# Patient Record
Sex: Female | Born: 1935 | Race: White | Hispanic: No | Smoking: Never smoker
Health system: Southern US, Community
[De-identification: ages and names within clinical notes are randomized; demographics above are authoritative.]

## PROBLEM LIST (undated history)

## (undated) DIAGNOSIS — F0280 Dementia in other diseases classified elsewhere without behavioral disturbance: Secondary | ICD-10-CM

## (undated) DIAGNOSIS — G309 Alzheimer's disease, unspecified: Secondary | ICD-10-CM

---

## 1982-03-27 HISTORY — PX: KNEE SURGERY: SHX244

## 2014-05-26 HISTORY — PX: HIP FRACTURE SURGERY: SHX118

## 2015-04-14 DIAGNOSIS — H903 Sensorineural hearing loss, bilateral: Secondary | ICD-10-CM | POA: Diagnosis not present

## 2015-04-14 DIAGNOSIS — H9190 Unspecified hearing loss, unspecified ear: Secondary | ICD-10-CM | POA: Diagnosis not present

## 2015-05-25 DIAGNOSIS — L304 Erythema intertrigo: Secondary | ICD-10-CM | POA: Diagnosis not present

## 2015-12-10 DIAGNOSIS — R110 Nausea: Secondary | ICD-10-CM | POA: Diagnosis not present

## 2015-12-10 DIAGNOSIS — R410 Disorientation, unspecified: Secondary | ICD-10-CM | POA: Diagnosis not present

## 2015-12-10 DIAGNOSIS — N390 Urinary tract infection, site not specified: Secondary | ICD-10-CM | POA: Diagnosis not present

## 2015-12-10 DIAGNOSIS — R5383 Other fatigue: Secondary | ICD-10-CM | POA: Diagnosis not present

## 2015-12-10 DIAGNOSIS — F0390 Unspecified dementia without behavioral disturbance: Secondary | ICD-10-CM | POA: Diagnosis not present

## 2015-12-10 DIAGNOSIS — S0003XA Contusion of scalp, initial encounter: Secondary | ICD-10-CM | POA: Diagnosis not present

## 2015-12-10 DIAGNOSIS — S0990XA Unspecified injury of head, initial encounter: Secondary | ICD-10-CM | POA: Diagnosis not present

## 2017-03-27 ENCOUNTER — Other Ambulatory Visit: Payer: Self-pay

## 2017-03-27 ENCOUNTER — Ambulatory Visit
Admission: EM | Admit: 2017-03-27 | Discharge: 2017-03-27 | Disposition: A | Discharge status: Home / Self Care | Payer: Medicare Other | Attending: Family Medicine | Admitting: Family Medicine

## 2017-03-27 DIAGNOSIS — B353 Tinea pedis: Secondary | ICD-10-CM | POA: Diagnosis not present

## 2017-03-27 DIAGNOSIS — L03031 Cellulitis of right toe: Secondary | ICD-10-CM

## 2017-03-27 MED ORDER — CEPHALEXIN 250 MG/5ML PO SUSR: 500.0000 mg | Freq: Three times a day (TID) | ORAL | 0 refills | Status: AC

## 2017-03-27 NOTE — ED Triage Notes (Signed)
Patient presents to [facility] with daughter. Patient daughter states that a few weeks ago her caregiver reported some redness on both feet. Patient daughter bought some OTC fungal cream that made the redness worse. They have since been treating feet with charcoal. Patient daughter states that feet have been worsening.

## 2017-03-27 NOTE — ED Provider Notes (Signed)
[facility]    CSN: [id number] Arrival date & time: 03/27/17  1538     History   Chief Complaint Chief Complaint  Patient presents with  . Cellulitis    HPI Misty SarkMary Anderson is a 84 y.o. female.   84 yo female presents with daughter with a concern for redness to both feet. State patient has chronic toenail fungus. No drainage or fevers.    The history is provided by a relative.    History reviewed. No pertinent past medical history.  There are no active problems to display for this patient.   Past Surgical History:  Procedure Laterality Date  . HIP FRACTURE SURGERY Left 05/2014  . KNEE SURGERY Right 1984    OB History    No data available       Home Medications    Prior to Admission medications   Medication Sig Start Date End Date Taking? Authorizing Provider  cephALEXin (KEFLEX) 250 MG/5ML suspension Take 10 mLs (500 mg total) by mouth 3 (three) times daily for 7 days. 03/27/17 04/03/17  Payton Mccallumonty, Anyelina Claycomb, MD    Family History Family History  Problem Relation Age of Onset  . Alzheimer's disease Mother   . Cancer Father     Social History Social History   Tobacco Use  . Smoking status: Never Smoker  . Smokeless tobacco: Never Used  Substance Use Topics  . Alcohol use: No    Frequency: Never  . Drug use: No     Allergies   Patient has no known allergies.   Review of Systems Review of Systems   Physical Exam Triage Vital Signs ED Triage Vitals  Enc Vitals Group     BP 03/27/17 1607 (!) 176/74     Pulse Rate 03/27/17 1607 71     Resp 03/27/17 1607 16     Temp 03/27/17 1607 98.7 F (37.1 C)     Temp Source 03/27/17 1607 Oral     SpO2 03/27/17 1607 99 %     Weight 03/27/17 1607 115 lb (52.2 kg)     Height 03/27/17 1607 5' (1.524 m)     Head Circumference --      Peak Flow --      Pain Score 03/27/17 1608 0     Pain Loc --      Pain Edu? --      Excl. in GC? --    No data found.  Updated Vital Signs BP (!) 176/74 (BP Location:  Left Arm)   Pulse 71   Temp 98.7 F (37.1 C) (Oral)   Resp 16   Ht 5' (1.524 m)   Wt 115 lb (52.2 kg)   SpO2 99%   BMI 22.46 kg/m   Visual Acuity Right Eye Distance:   Left Eye Distance:   Bilateral Distance:    Right Eye Near:   Left Eye Near:    Bilateral Near:     Physical Exam  Constitutional: She appears well-developed and well-nourished. No distress.  Skin: She is not diaphoretic. There is erythema.  Right 4th toe with mild edema, erythema; feet with cracked skin, diffuse erythema; thickened discolored toenails  Nursing note and vitals reviewed.    UC Treatments / Results  Labs (all labs ordered are listed, but only abnormal results are displayed) Labs Reviewed - No data to display  EKG  EKG Interpretation None       Radiology No results found.  Procedures Procedures (including critical care time)  Medications Ordered  in UC Medications - No data to display   Initial Impression / Assessment and Plan / UC Course  I have reviewed the triage vital signs and the nursing notes.  Pertinent labs & imaging results that were available during my care of the patient were reviewed by me and considered in my medical decision making (see chart for details).        Final Clinical Impressions(s) / UC Diagnoses   Final diagnoses:  Cellulitis of toe of right foot  Tinea pedis of both feet    ED Discharge Orders        Ordered    cephALEXin (KEFLEX) 250 MG/5ML suspension  3 times daily     03/27/17 1630     1. diagnosis reviewed with family 2. rx as per orders above; reviewed possible side effects, interactions, risks and benefits  3. Recommend supportive treatment with otc antifungal powder to feet bid 4. Follow-up prn if symptoms worsen or don't improve   Controlled Substance Prescriptions [place] Controlled Substance Registry consulted? Not Applicable   Payton Mccallum, MD 03/27/17 [phone]

## 2017-06-27 DIAGNOSIS — F0390 Unspecified dementia without behavioral disturbance: Secondary | ICD-10-CM | POA: Diagnosis not present

## 2017-09-17 ENCOUNTER — Other Ambulatory Visit: Payer: Self-pay

## 2017-09-17 ENCOUNTER — Inpatient Hospital Stay
Admission: EM | Admit: 2017-09-17 | Discharge: 2017-09-19 | DRG: 690 | Disposition: A | Discharge status: Home / Self Care | Payer: Medicare Other | Attending: Family Medicine | Admitting: Family Medicine

## 2017-09-17 ENCOUNTER — Emergency Department: Payer: Medicare Other

## 2017-09-17 ENCOUNTER — Encounter: Payer: Self-pay | Admitting: Emergency Medicine

## 2017-09-17 DIAGNOSIS — G9349 Other encephalopathy: Secondary | ICD-10-CM | POA: Diagnosis present

## 2017-09-17 DIAGNOSIS — R4020 Unspecified coma: Secondary | ICD-10-CM | POA: Diagnosis not present

## 2017-09-17 DIAGNOSIS — G309 Alzheimer's disease, unspecified: Secondary | ICD-10-CM | POA: Diagnosis present

## 2017-09-17 DIAGNOSIS — N390 Urinary tract infection, site not specified: Secondary | ICD-10-CM | POA: Diagnosis not present

## 2017-09-17 DIAGNOSIS — J209 Acute bronchitis, unspecified: Secondary | ICD-10-CM | POA: Diagnosis present

## 2017-09-17 DIAGNOSIS — E871 Hypo-osmolality and hyponatremia: Secondary | ICD-10-CM | POA: Diagnosis present

## 2017-09-17 DIAGNOSIS — R001 Bradycardia, unspecified: Secondary | ICD-10-CM | POA: Diagnosis not present

## 2017-09-17 DIAGNOSIS — Z66 Do not resuscitate: Secondary | ICD-10-CM | POA: Diagnosis present

## 2017-09-17 DIAGNOSIS — E860 Dehydration: Secondary | ICD-10-CM | POA: Diagnosis present

## 2017-09-17 DIAGNOSIS — G9340 Encephalopathy, unspecified: Secondary | ICD-10-CM | POA: Diagnosis not present

## 2017-09-17 DIAGNOSIS — R4182 Altered mental status, unspecified: Secondary | ICD-10-CM | POA: Diagnosis not present

## 2017-09-17 DIAGNOSIS — F0280 Dementia in other diseases classified elsewhere without behavioral disturbance: Secondary | ICD-10-CM | POA: Diagnosis present

## 2017-09-17 DIAGNOSIS — E46 Unspecified protein-calorie malnutrition: Secondary | ICD-10-CM | POA: Diagnosis present

## 2017-09-17 DIAGNOSIS — M6281 Muscle weakness (generalized): Secondary | ICD-10-CM | POA: Diagnosis not present

## 2017-09-17 DIAGNOSIS — Z6821 Body mass index (BMI) 21.0-21.9, adult: Secondary | ICD-10-CM

## 2017-09-17 DIAGNOSIS — R509 Fever, unspecified: Secondary | ICD-10-CM | POA: Diagnosis present

## 2017-09-17 DIAGNOSIS — I10 Essential (primary) hypertension: Secondary | ICD-10-CM | POA: Diagnosis present

## 2017-09-17 DIAGNOSIS — Z87898 Personal history of other specified conditions: Secondary | ICD-10-CM

## 2017-09-17 DIAGNOSIS — N3000 Acute cystitis without hematuria: Secondary | ICD-10-CM | POA: Diagnosis not present

## 2017-09-17 HISTORY — DX: Alzheimer's disease, unspecified: G30.9

## 2017-09-17 HISTORY — DX: Dementia in other diseases classified elsewhere, unspecified severity, without behavioral disturbance, psychotic disturbance, mood disturbance, and anxiety: F02.80

## 2017-09-17 LAB — URINALYSIS, COMPLETE (UACMP) WITH MICROSCOPIC
Bilirubin Urine: NEGATIVE
Glucose, UA: NEGATIVE mg/dL
Ketones, ur: NEGATIVE mg/dL
Nitrite: POSITIVE — AB
PROTEIN: NEGATIVE mg/dL
Specific Gravity, Urine: 1.008 (ref 1.005–1.030)
pH: 7 (ref 5.0–8.0)

## 2017-09-17 LAB — CBC WITH DIFFERENTIAL/PLATELET
Basophils Absolute: 0 10*3/uL (ref 0–0.1)
Basophils Relative: 0 %
EOS PCT: 0 %
Eosinophils Absolute: 0 10*3/uL (ref 0–0.7)
HCT: 36.5 % (ref 35.0–47.0)
HEMOGLOBIN: 12.2 g/dL (ref 12.0–16.0)
LYMPHS ABS: 1.7 10*3/uL (ref 1.0–3.6)
Lymphocytes Relative: 22 %
MCH: 30.5 pg (ref 26.0–34.0)
MCHC: 33.4 g/dL (ref 32.0–36.0)
MCV: 91.3 fL (ref 80.0–100.0)
MONO ABS: 0.8 10*3/uL (ref 0.2–0.9)
MONOS PCT: 11 %
Neutro Abs: 5 10*3/uL (ref 1.4–6.5)
Neutrophils Relative %: 67 %
Platelets: 122 10*3/uL — ABNORMAL LOW (ref 150–440)
RBC: 4 MIL/uL (ref 3.80–5.20)
RDW: 14.7 % — AB (ref 11.5–14.5)
WBC: 7.6 10*3/uL (ref 3.6–11.0)

## 2017-09-17 LAB — COMPREHENSIVE METABOLIC PANEL
ALBUMIN: 3.3 g/dL — AB (ref 3.5–5.0)
ALK PHOS: 59 U/L (ref 38–126)
ALT: 12 U/L — AB (ref 14–54)
AST: 24 U/L (ref 15–41)
Anion gap: 8 (ref 5–15)
BUN: 18 mg/dL (ref 6–20)
CALCIUM: 8.5 mg/dL — AB (ref 8.9–10.3)
CHLORIDE: 100 mmol/L — AB (ref 101–111)
CO2: 22 mmol/L (ref 22–32)
CREATININE: 0.57 mg/dL (ref 0.44–1.00)
GFR calc non Af Amer: >60 mL/min (ref >60)
GLUCOSE: 100 mg/dL — AB (ref 65–99)
Potassium: 3.7 mmol/L (ref 3.5–5.1)
Sodium: 130 mmol/L — ABNORMAL LOW (ref 135–145)
Total Bilirubin: 0.7 mg/dL (ref 0.3–1.2)
Total Protein: 7.4 g/dL (ref 6.5–8.1)

## 2017-09-17 LAB — MRSA PCR SCREENING: MRSA BY PCR: NEGATIVE

## 2017-09-17 LAB — INFLUENZA PANEL BY PCR (TYPE A & B)
Influenza A By PCR: NEGATIVE
Influenza B By PCR: NEGATIVE

## 2017-09-17 MED ORDER — SODIUM CHLORIDE 0.9 % IV BOLUS
1000.0000 mL | Freq: Once | INTRAVENOUS | Status: AC
  Administered 2017-09-17: 1000 mL via INTRAVENOUS

## 2017-09-17 MED ORDER — ACETAMINOPHEN 325 MG PO TABS: 650.0000 mg | ORAL_TABLET | Freq: Four times a day (QID) | ORAL | Status: DC | PRN

## 2017-09-17 MED ORDER — ACETAMINOPHEN 650 MG RE SUPP: 650.0000 mg | Freq: Four times a day (QID) | RECTAL | Status: DC | PRN

## 2017-09-17 MED ORDER — SODIUM CHLORIDE 0.9 % IV SOLN
INTRAVENOUS | Status: DC
  Administered 2017-09-17 – 2017-09-18 (×2): via INTRAVENOUS

## 2017-09-17 MED ORDER — ENOXAPARIN SODIUM 40 MG/0.4ML ~~LOC~~ SOLN
40.0000 mg | SUBCUTANEOUS | Status: DC
  Administered 2017-09-17: 40 mg via SUBCUTANEOUS
  Filled 2017-09-17 (×2): qty 0.4

## 2017-09-17 MED ORDER — ONDANSETRON HCL 4 MG/2ML IJ SOLN: 4.0000 mg | Freq: Four times a day (QID) | INTRAMUSCULAR | Status: DC | PRN

## 2017-09-17 MED ORDER — SODIUM CHLORIDE 0.9 % IV BOLUS
1000.0000 mL | Freq: Once | INTRAVENOUS | Status: AC
Start: 2017-09-17 — End: 2017-09-17
  Administered 2017-09-17: 1000 mL via INTRAVENOUS

## 2017-09-17 MED ORDER — SODIUM CHLORIDE 0.9 % IV SOLN
500.0000 mg | Freq: Every day | INTRAVENOUS | Status: DC
  Filled 2017-09-17: qty 500

## 2017-09-17 MED ORDER — SODIUM CHLORIDE 0.9 % IV SOLN
500.0000 mg | INTRAVENOUS | Status: DC
  Filled 2017-09-17: qty 500

## 2017-09-17 MED ORDER — AZITHROMYCIN 500 MG IV SOLR
500.0000 mg | Freq: Once | INTRAVENOUS | Status: AC
  Administered 2017-09-17: 500 mg via INTRAVENOUS
  Filled 2017-09-17: qty 500

## 2017-09-17 MED ORDER — SODIUM CHLORIDE 0.9 % IV SOLN
1.0000 g | Freq: Once | INTRAVENOUS | Status: AC
  Administered 2017-09-17: 1 g via INTRAVENOUS
  Filled 2017-09-17: qty 10

## 2017-09-17 MED ORDER — ONDANSETRON HCL 4 MG PO TABS
4.0000 mg | ORAL_TABLET | Freq: Four times a day (QID) | ORAL | Status: DC | PRN
Start: 2017-09-17 — End: 2017-09-19

## 2017-09-17 MED ORDER — SODIUM CHLORIDE 0.9 % IV SOLN
1.0000 g | INTRAVENOUS | Status: DC
  Administered 2017-09-18 – 2017-09-19 (×2): 1 g via INTRAVENOUS
  Filled 2017-09-17: qty 10
  Filled 2017-09-17: qty 1

## 2017-09-17 MED ORDER — SODIUM CHLORIDE 0.9 % IV SOLN: 1.0000 g | INTRAVENOUS | Status: DC

## 2017-09-17 NOTE — H&P (Addendum)
[facility] - [place] [facility]   PATIENT NAME: Misty Anderson    MR#:  [id number]  DATE OF BIRTH:  [birth year]  DATE OF ADMISSION:  09/17/2017  PRIMARY CARE PHYSICIAN: Ulice Brilliant, MD   REQUESTING/REFERRING PHYSICIAN: Arnaldo Natal, MD  CHIEF COMPLAINT:  AMS  HISTORY OF PRESENT ILLNESS:  Misty Anderson  is a 84 y.o. female with a known history of Alzheimer's dementia was awake and alert until yesterday and feeding herself is  quite altered today.  Patient at her baseline talks and walks with the help of a walker.  Patient's temperature was at 106 at home and by the time.  Patient came into the ED her temperature was 99.8.  Patient is arousable and unable to give any history.  History is obtained from the patient's daughter.  CT head negative chest x-ray negative.  Flu test is pending Patient has been coughing yellowish phlegm according to the patient's daughter for the past few days  PAST MEDICAL HISTORY:   Past Medical History:  Diagnosis Date  . Alzheimer disease     PAST SURGICAL HISTOIRY:   Past Surgical History:  Procedure Laterality Date  . HIP FRACTURE SURGERY Left 05/2014  . KNEE SURGERY Right 1984    SOCIAL HISTORY:   Social History   Tobacco Use  . Smoking status: Never Smoker  . Smokeless tobacco: Never Used  Substance Use Topics  . Alcohol use: No    Frequency: Never    FAMILY HISTORY:   Family History  Problem Relation Age of Onset  . Alzheimer's disease Mother   . Cancer Father     DRUG ALLERGIES:  No Known Allergies  REVIEW OF SYSTEMS:  Ros UNOBTAINABLE  MEDICATIONS AT HOME:   Prior to Admission medications   Medication Sig Start Date End Date Taking? Authorizing Provider  Ascorbic Acid (VITAMIN C) 100 MG tablet Take 1 tablet by mouth daily.   Yes [provider]  Cranberry 400 MG CAPS Take 1 capsule by mouth daily.   Yes [provider]      VITAL SIGNS:  Blood pressure (!) 176/81, pulse (!)  54, temperature 98 F (36.7 C), temperature source Oral, resp. rate 18, height 5\' 2"  (1.575 m), weight 52.2 kg (115 lb), SpO2 100 %.  PHYSICAL EXAMINATION:  GENERAL:  84 y.o.-year-old patient lying in the bed with no acute distress.  EYES: Pupils equal, round, reactive to light and accommodation. No scleral icterus. Extraocular muscles intact.  HEENT: Head atraumatic, normocephalic. Oropharynx and nasopharynx clear.  NECK:  Supple, no jugular venous distention. No thyroid enlargement, no tenderness.  LUNGS: mOd breath sounds bilaterally, no wheezing, rales,rhonchi or crepitation. No use of accessory muscles of respiration.  CARDIOVASCULAR: S1, S2 normal. No murmurs, rubs, or gallops.  ABDOMEN: Soft, nontender, nondistended. Bowel sounds present. No organomegaly or mass.  EXTREMITIES: No pedal edema, cyanosis, or clubbing.  NEUROLOGIC: Lethargic but arousable but disoriented PSYCHIATRIC: The patient is arousable and disoriented SKIN: No obvious rash, lesion, or ulcer.   LABORATORY PANEL:   CBC Recent Labs  Lab 09/17/17 1237  WBC 7.6  HGB 12.2  HCT 36.5  PLT 122*   ------------------------------------------------------------------------------------------------------------------  Chemistries  Recent Labs  Lab 09/17/17 1237  NA 130*  K 3.7  CL 100*  CO2 22  GLUCOSE 100*  BUN 18  CREATININE 0.57  CALCIUM 8.5*  AST 24  ALT 12*  ALKPHOS 59  BILITOT 0.7   ------------------------------------------------------------------------------------------------------------------  Cardiac Enzymes No results for  input(s): TROPONINI in the last 168 hours. ------------------------------------------------------------------------------------------------------------------  RADIOLOGY:  Ct Head Wo Contrast  Result Date: 09/17/2017 CLINICAL DATA:  Altered level of consciousness. EXAM: CT HEAD WITHOUT CONTRAST TECHNIQUE: Contiguous axial images were obtained from the base of the skull  through the vertex without intravenous contrast. COMPARISON:  None. FINDINGS: Brain: Moderate to advanced atrophy. Negative for hydrocephalus. Extensive chronic white matter changes. Negative for acute infarct. Negative for hemorrhage mass or fluid collection. Vascular: Negative for hyperdense vessel. Heavily calcified arteries. Skull: Large lytic lesion in the occipital bone in the midline most compatible with a benign lesion such as hemangioma. This has fatty density. Sinuses/Orbits: Mucosal edema in the left sphenoid maxillary and ethmoid sinuses. Negative orbit Other: None IMPRESSION: Moderate to advanced atrophy and chronic microvascular ischemia. No acute intracranial abnormality. Electronically Signed   By: Marlan Palauharles  Clark M.D.   On: 09/17/2017 13:22   Dg Chest Portable 1 View  Result Date: 09/17/2017 CLINICAL DATA:  Altered mental status. Underlying Alzheimer's disease. Nonsmoker. EXAM: PORTABLE CHEST 1 VIEW COMPARISON:  None in PACs FINDINGS: The lungs are adequately inflated and clear. The heart is top-normal in size. The pulmonary vascularity is normal. There is mural calcification in the thoracic aorta with tortuosity of its ascending and descending components. The mediastinum is grossly normal in width. The bony thorax is unremarkable. IMPRESSION: There is no active cardiopulmonary disease. Thoracic aortic atherosclerosis. Electronically Signed   By: David  [place] M.D.   On: 09/17/2017 13:16    EKG:   Orders placed or performed during the hospital encounter of 09/17/17  . EKG 12-Lead  . EKG 12-Lead  . ED EKG  . ED EKG  . EKG 12-Lead  . EKG 12-Lead    IMPRESSION AND PLAN:  Misty SarkMary Anderson  is a 84 y.o. female with a known history of Alzheimer's dementia was awake and alert until yesterday and feeding herself is  quite altered today.  Patient at her baseline talks and walks with the help of a walker.  Patient's temperature was at 106 at home and by the time.  Patient came into the ED her  temperature was 99.8.  Patient is arousable and unable to give any history.  History is obtained from the patient's daughter.  CT head negative chest x-ray negative   #Acute encephalopathy from acute bronchitis-probably viral  admit to MedSurg unit IV Rocephin and azithromycin, bronchodilator treatments We will get sputum culture and sensitivity Hydrate with IV fluids and symptomatic treatment  neurochecks  #Hyponatremia from dehydration provide gentle hydration with IV fluids  #Fever probably from acute cystitis and bronchitis- prob viral Blood cultures, urine cultures, sputum culture and sensitivity Flu test is neg Supportive treatment  Urinalysis is abnormal IV Rocephin and azithromycin  #chronic  Alzheimer's dementia Continue close monitoring patient is currently n.p.o.  GI and DVT prophylaxis All the records are reviewed and case discussed with ED provider. Management plans discussed with the patient, family and they are in agreement.  CODE STATUS: DNR   TOTAL TIME TAKING CARE OF THIS PATIENT: 41  minutes.   Note: This dictation was prepared with Dragon dictation along with smaller phrase technology. Any transcriptional errors that result from this process are unintentional.  Ramonita LabAruna Ples Trudel M.D on 09/17/2017 at 4:10 PM  Between 7am to 6pm - Pager - [phone]  After 6pm go to [email or web address] - password [email or web address] [facility]  [facility]  Office  [phone]  CC: Primary care physician; Ulice BrilliantBarnes, Brad, MD

## 2017-09-17 NOTE — ED Provider Notes (Signed)
[facility] Emergency Department Provider Note   ____________________________________________   First MD Initiated Contact with Patient 09/17/17 1244     (approximate)  I have reviewed the triage vital signs and the nursing notes.   HISTORY  Chief Complaint Weakness History limited by unresponsiveness   HPI Clatie Kessen is a 84 y.o. female who lives at home and usually wakes up and talks and walks with a walker.  Family reports today she was unable to do any walking really was not speaking much seem to be mostly unresponsive and more confused than usual.  She had a temperature that look like it was going up to 106 on the thermometer.  Family put cool compresses on her and brought her to the emergency room.  Here her temperature was 99.8.  She response to pain really not anything else.  Patient had symptoms similar to this a few years ago and had pneumonia.  Family reports now she is been coughing up yellowish phlegm for the last couple days.   Past Medical History:  Diagnosis Date  . Alzheimer disease     Patient Active Problem List   Diagnosis Date Noted  . Fever 09/17/2017  . Altered mental status 09/17/2017    Past Surgical History:  Procedure Laterality Date  . HIP FRACTURE SURGERY Left 05/2014  . KNEE SURGERY Right 1984    Prior to Admission medications   Medication Sig Start Date End Date Taking? Authorizing Provider  Ascorbic Acid (VITAMIN C) 100 MG tablet Take 1 tablet by mouth daily.   Yes [provider]  Cranberry 400 MG CAPS Take 1 capsule by mouth daily.   Yes [provider]    Allergies Patient has no known allergies.  Family History  Problem Relation Age of Onset  . Alzheimer's disease Mother   . Cancer Father     Social History Social History   Tobacco Use  . Smoking status: Never Smoker  . Smokeless tobacco: Never Used  Substance Use Topics  . Alcohol use: No    Frequency: Never  . Drug use: No     Review of Systems  Unable to obtain ____________________________________________   PHYSICAL EXAM:  VITAL SIGNS: ED Triage Vitals [09/17/17 1233]  Enc Vitals Group     BP (!) 187/78     Pulse Rate 60     Resp 20     Temp 99.8 F (37.7 C)     Temp Source Axillary     SpO2 97 %     Weight 115 lb (52.2 kg)     Height 5\' 2"  (1.575 m)     Head Circumference      Peak Flow      Pain Score      Pain Loc      Pain Edu?      Excl. in GC?     Constitutional: Sleepy limited responsiveness Eyes: Patient not opening eyes Head: Atraumatic. Nose: No congestion/rhinnorhea. Mouth/Throat: Mucous membranes are moist.   Neck: No stridor.neck is supple cardiovascular: Normal rate, regular rhythm. Grossly normal heart sounds.  Good peripheral circulation. Respiratory: Normal respiratory effort.  No retractions. Lungs CTAB. Gastrointestinal: Soft and nontender. No distention. No abdominal bruits. No CVA tenderness. Musculoskeletal: No lower extremity tenderness nor edema.   Neurologic:  Normal speech and language. No gross focal neurologic deficits are appreciated. No gait instability. Skin:  Skin is warm, dry and intact. No rash noted. Psychiatric: Mood and affect are normal. Speech and  behavior are normal.  ____________________________________________   LABS (all labs ordered are listed, but only abnormal results are displayed)  Labs Reviewed  URINALYSIS, COMPLETE (UACMP) WITH MICROSCOPIC - Abnormal; Notable for the following components:      Result Value   Color, Urine YELLOW (*)    APPearance CLEAR (*)    Hgb urine dipstick MODERATE (*)    Nitrite POSITIVE (*)    Leukocytes, UA SMALL (*)    Bacteria, UA MANY (*)    All other components within normal limits  COMPREHENSIVE METABOLIC PANEL - Abnormal; Notable for the following components:   Sodium 130 (*)    Chloride 100 (*)    Glucose, Bld 100 (*)    Calcium 8.5 (*)    Albumin 3.3 (*)    ALT 12 (*)    All other  components within normal limits  CBC WITH DIFFERENTIAL/PLATELET - Abnormal; Notable for the following components:   RDW 14.7 (*)    Platelets 122 (*)    All other components within normal limits  URINE CULTURE  CULTURE, BLOOD (ROUTINE X 2)  CULTURE, BLOOD (ROUTINE X 2)  INFLUENZA PANEL BY PCR (TYPE A & B)   ____________________________________________  EKG  EKG read and interpreted by me shows sinus bradycardia rate 57 ____________________________________________  RADIOLOGY  ED MD interpretation: CT of the head read by radiology is negative chest x-ray read by radiology and reviewed by me is negative there may be a little bit of haziness of the left diaphragm though.  Official radiology report(s): Ct Head Wo Contrast  Result Date: 09/17/2017 CLINICAL DATA:  Altered level of consciousness. EXAM: CT HEAD WITHOUT CONTRAST TECHNIQUE: Contiguous axial images were obtained from the base of the skull through the vertex without intravenous contrast. COMPARISON:  None. FINDINGS: Brain: Moderate to advanced atrophy. Negative for hydrocephalus. Extensive chronic white matter changes. Negative for acute infarct. Negative for hemorrhage mass or fluid collection. Vascular: Negative for hyperdense vessel. Heavily calcified arteries. Skull: Large lytic lesion in the occipital bone in the midline most compatible with a benign lesion such as hemangioma. This has fatty density. Sinuses/Orbits: Mucosal edema in the left sphenoid maxillary and ethmoid sinuses. Negative orbit Other: None IMPRESSION: Moderate to advanced atrophy and chronic microvascular ischemia. No acute intracranial abnormality. Electronically Signed   By: Marlan Palauharles  Clark M.D.   On: 09/17/2017 13:22   Dg Chest Portable 1 View  Result Date: 09/17/2017 CLINICAL DATA:  Altered mental status. Underlying Alzheimer's disease. Nonsmoker. EXAM: PORTABLE CHEST 1 VIEW COMPARISON:  None in PACs FINDINGS: The lungs are adequately inflated and clear. The  heart is top-normal in size. The pulmonary vascularity is normal. There is mural calcification in the thoracic aorta with tortuosity of its ascending and descending components. The mediastinum is grossly normal in width. The bony thorax is unremarkable. IMPRESSION: There is no active cardiopulmonary disease. Thoracic aortic atherosclerosis. Electronically Signed   By: David  [place]Jordan M.D.   On: 09/17/2017 13:16    ____________________________________________   PROCEDURES  Procedure(s) performed:   Procedures  Critical Care performed:   ____________________________________________   INITIAL IMPRESSION / ASSESSMENT AND PLAN / ED COURSE  Patient has altered mental status but nothing really on CT or chest x-ray except for some mucosal edema.  And some slightly low sodium.  I am not sure what to make of the history of 160 degrees at home since her temperature is been 99 or less here.  However with that history and altered mental status we will get  her in the hospital.  I have ordered antibiotics IV for her already we will give her fluids to get the urine as she is not having any urine that we can get even by cath.         ____________________________________________   FINAL CLINICAL IMPRESSION(S) / ED DIAGNOSES  Final diagnoses:  Altered mental status, unspecified altered mental status type  History of fever     ED Discharge Orders    None       Note:  This document was prepared using Dragon voice recognition software and may include unintentional dictation errors.    Arnaldo Natal, MD 09/17/17 [phone]

## 2017-09-17 NOTE — Progress Notes (Signed)
RN Beth calls and states that patient is at her baseline according to the daughters and  family requesting to start her on diet.  Ordered regular diet

## 2017-09-17 NOTE — Progress Notes (Signed)
Pt received to room #150 via stretcher from ED. Daughter at bedside with pt. Pt alert, confused. Minimal verbal response. Daughter states pt is back to her baseline mental status. Pt denies pain or discomfort at this time. Pt, daughter oriented to room. Daughter requests that pt be allowed to eat.

## 2017-09-17 NOTE — ED Notes (Signed)
Spoke with MD regarding inability to obtain urine specimen. Per MD, hold antibiotics until blood cultures and urine specimen obtained. Verbal order for IV fluids given. Will continue to monitor for urine output.

## 2017-09-17 NOTE — Progress Notes (Signed)
Family Meeting Note  Advance Directive:yes  Today a meeting took place with the Patient's daughter  Patient is unable to participate due GN:FAOZHYto:Lacked capacity altered   The following clinical team members were present during this meeting:MD  The following were discussed:Patient's diagnosis: Acute encephalopathy, acute bronchitis, hyponatremia, fever high-grade, other comorbidities Alzheimer's dementia, treatment plan of care discussed in detail with the patient's daughter.  She verbalized understanding of the plan   patient's progosis: Unable to determine and Goals for treatment: DNR  Daughter is hcpoa  Additional follow-up to be provided: hospitalist  Time spent during discussion:18 min  Ramonita LabAruna Dayvian Blixt, MD

## 2017-09-17 NOTE — ED Notes (Signed)
Patient transported to radiology

## 2017-09-17 NOTE — ED Triage Notes (Signed)
Patient to ED via [facility]. Family states they have noticed increased weakness and well as decrease in verbalizations since waking up this morning. Per home health, patient was unable to give assistance to stand and transfer to wheelchair which she is typically able to do. Patient has history of alzheimers. Patient responsive to painful stimuli only upon arrival.

## 2017-09-17 NOTE — ED Notes (Signed)
This RN and [place], RN attempted to in and out cath x2. Per family, patient voided immediately prior to coming to ED and has not had anything to eat or drink today.

## 2017-09-18 LAB — CBC
HCT: 33.5 % — ABNORMAL LOW (ref 35.0–47.0)
Hemoglobin: 11.2 g/dL — ABNORMAL LOW (ref 12.0–16.0)
MCH: 30.3 pg (ref 26.0–34.0)
MCHC: 33.4 g/dL (ref 32.0–36.0)
MCV: 90.8 fL (ref 80.0–100.0)
PLATELETS: 116 10*3/uL — AB (ref 150–440)
RBC: 3.7 MIL/uL — AB (ref 3.80–5.20)
RDW: 14.8 % — ABNORMAL HIGH (ref 11.5–14.5)
WBC: 7.5 10*3/uL (ref 3.6–11.0)

## 2017-09-18 LAB — COMPREHENSIVE METABOLIC PANEL
ALBUMIN: 2.9 g/dL — AB (ref 3.5–5.0)
ALT: 11 U/L (ref 0–44)
AST: 20 U/L (ref 15–41)
Alkaline Phosphatase: 52 U/L (ref 38–126)
Anion gap: 6 (ref 5–15)
BUN: 13 mg/dL (ref 8–23)
CHLORIDE: 107 mmol/L (ref 98–111)
CO2: 22 mmol/L (ref 22–32)
CREATININE: 0.57 mg/dL (ref 0.44–1.00)
Calcium: 7.6 mg/dL — ABNORMAL LOW (ref 8.9–10.3)
GFR calc non Af Amer: >60 mL/min (ref >60)
GLUCOSE: 97 mg/dL (ref 70–99)
Potassium: 3.7 mmol/L (ref 3.5–5.1)
SODIUM: 135 mmol/L (ref 135–145)
Total Bilirubin: 0.8 mg/dL (ref 0.3–1.2)
Total Protein: 6.4 g/dL — ABNORMAL LOW (ref 6.5–8.1)

## 2017-09-18 LAB — AMMONIA: AMMONIA: 17 umol/L (ref 9–35)

## 2017-09-18 MED ORDER — CLONIDINE HCL 0.2 MG/24HR TD PTWK
0.2000 mg | MEDICATED_PATCH | TRANSDERMAL | Status: DC
  Filled 2017-09-18: qty 1

## 2017-09-18 MED ORDER — HYDRALAZINE HCL 20 MG/ML IJ SOLN
10.0000 mg | INTRAMUSCULAR | Status: DC | PRN
  Administered 2017-09-18: 10 mg via INTRAVENOUS
  Filled 2017-09-18: qty 1

## 2017-09-18 MED ORDER — AMLODIPINE BESYLATE 10 MG PO TABS
10.0000 mg | ORAL_TABLET | Freq: Every day | ORAL | Status: DC
  Administered 2017-09-19: 10 mg via ORAL
  Filled 2017-09-18: qty 1

## 2017-09-18 NOTE — Progress Notes (Signed)
Pt daughter does not want her mother taking blood pressure medications, educated. Per pt Daughter blood pressure elevation is stress related. Daughter refuses MRI, palliative consult, and spiritual care. Per the pt daughter this is her mothers baseline orientation. Daughter is very supportive of her mother and hopes she can come home tomorrow.

## 2017-09-18 NOTE — Progress Notes (Addendum)
[employer] [facility]   PATIENT NAME: Misty Anderson    MR#:  [id number]  DATE OF BIRTH:  January 30, 1916  SUBJECTIVE:  CHIEF COMPLAINT:   Chief Complaint  Patient presents with  . Weakness  Patient is lethargic, resting comfortably in bed, no events overnight per nursing staff, chest x-ray without evidence of pneumonia-discontinue azithromycin, urinalysis consistent with probable UTI, noted uncontrolled hypertension, start clonidine patch, palliative care consulted given poor prognosis given it extreme age/chronic dementia, speech therapy to see  REVIEW OF SYSTEMS:  CONSTITUTIONAL: No fever, fatigue or weakness.  EYES: No blurred or double vision.  EARS, NOSE, AND THROAT: No tinnitus or ear pain.  RESPIRATORY: No cough, shortness of breath, wheezing or hemoptysis.  CARDIOVASCULAR: No chest pain, orthopnea, edema.  GASTROINTESTINAL: No nausea, vomiting, diarrhea or abdominal pain.  GENITOURINARY: No dysuria, hematuria.  ENDOCRINE: No polyuria, nocturia,  HEMATOLOGY: No anemia, easy bruising or bleeding SKIN: No rash or lesion. MUSCULOSKELETAL: No joint pain or arthritis.   NEUROLOGIC: No tingling, numbness, weakness.  PSYCHIATRY: No anxiety or depression.   ROS  DRUG ALLERGIES:  No Known Allergies  VITALS:  Blood pressure (!) 203/89, pulse 71, temperature 97.9 F (36.6 C), temperature source Oral, resp. rate (!) 22, height 5\' 2"  (1.575 m), weight 52.2 kg (115 lb), SpO2 95 %.  PHYSICAL EXAMINATION:  GENERAL:  84 y.o.-year-old patient lying in the bed with no acute distress.  EYES: Pupils equal, round, reactive to light and accommodation. No scleral icterus. Extraocular muscles intact.  HEENT: Head atraumatic, normocephalic. Oropharynx and nasopharynx clear.  NECK:  Supple, no jugular venous distention. No thyroid enlargement, no tenderness.  LUNGS: Normal breath sounds bilaterally, no wheezing, rales,rhonchi or crepitation. No use of accessory muscles of  respiration.  CARDIOVASCULAR: S1, S2 normal. No murmurs, rubs, or gallops.  ABDOMEN: Soft, nontender, nondistended. Bowel sounds present. No organomegaly or mass.  EXTREMITIES: No pedal edema, cyanosis, or clubbing.  NEUROLOGIC: Cranial nerves II through XII are intact. Muscle strength 5/5 in all extremities. Sensation intact. Gait not checked.  PSYCHIATRIC: The patient is alert and oriented x 3.  SKIN: No obvious rash, lesion, or ulcer.   Physical Exam LABORATORY PANEL:   CBC Recent Labs  Lab 09/18/17 0354  WBC 7.5  HGB 11.2*  HCT 33.5*  PLT 116*   ------------------------------------------------------------------------------------------------------------------  Chemistries  Recent Labs  Lab 09/18/17 0354  NA 135  K 3.7  CL 107  CO2 22  GLUCOSE 97  BUN 13  CREATININE 0.57  CALCIUM 7.6*  AST 20  ALT 11  ALKPHOS 52  BILITOT 0.8   ------------------------------------------------------------------------------------------------------------------  Cardiac Enzymes No results for input(s): TROPONINI in the last 168 hours. ------------------------------------------------------------------------------------------------------------------  RADIOLOGY:  Ct Head Wo Contrast  Result Date: 09/17/2017 CLINICAL DATA:  Altered level of consciousness. EXAM: CT HEAD WITHOUT CONTRAST TECHNIQUE: Contiguous axial images were obtained from the base of the skull through the vertex without intravenous contrast. COMPARISON:  None. FINDINGS: Brain: Moderate to advanced atrophy. Negative for hydrocephalus. Extensive chronic white matter changes. Negative for acute infarct. Negative for hemorrhage mass or fluid collection. Vascular: Negative for hyperdense vessel. Heavily calcified arteries. Skull: Large lytic lesion in the occipital bone in the midline most compatible with a benign lesion such as hemangioma. This has fatty density. Sinuses/Orbits: Mucosal edema in the left sphenoid maxillary and  ethmoid sinuses. Negative orbit Other: None IMPRESSION: Moderate to advanced atrophy and chronic microvascular ischemia. No acute intracranial abnormality. Electronically Signed   By: Leonette Mostharles  Chestine Spore M.D.   On: 09/17/2017 13:22   Dg Chest Portable 1 View  Result Date: 09/17/2017 CLINICAL DATA:  Altered mental status. Underlying Alzheimer's disease. Nonsmoker. EXAM: PORTABLE CHEST 1 VIEW COMPARISON:  None in PACs FINDINGS: The lungs are adequately inflated and clear. The heart is top-normal in size. The pulmonary vascularity is normal. There is mural calcification in the thoracic aorta with tortuosity of its ascending and descending components. The mediastinum is grossly normal in width. The bony thorax is unremarkable. IMPRESSION: There is no active cardiopulmonary disease. Thoracic aortic atherosclerosis. Electronically Signed   By: David  [place] M.D.   On: 09/17/2017 13:16    ASSESSMENT AND PLAN:  Misty Anderson  is a 84 y.o. female with a known history of Alzheimer's dementia was awake and alert until yesterday and feeding herself is  quite altered today.  Patient at her baseline talks and walks with the help of a walker.  Patient's temperature was at 106 at home and by the time.  Patient came into the ED her temperature was 99.8.  Patient is arousable and unable to give any history.  History is obtained from the patient's daughter.  CT head negative chest x-ray negative  *Acute encephalopathy  Compounded by chronic dementia  Most likely secondary to acute urinary tract infection  Neurochecks per routine, aspiration/fall precautions, check ammonia level, RPR, head of bed at 30 degrees at all times, IV fluids for gentle rehydration, empiric Rocephin for presumed UTI, speech therapy to evaluate/treat, MRI brain, and continue close medical monitoring  *Acute hyponatremia  Resolved with IV fluids for rehydration   *Acute urinary tract infection empiric  Rocephin and follow-up on cultures   *chronic  Alzheimer's dementia Compounded by extreme age Increase nursing care, aspiration/fall/skin care precautions while in house, palliative care consulted given long-term poor prognosis, speech therapy to evaluate/treat, dietary consultation  *Acute on chronic malnutrition Dietary consult  All the records are reviewed and case discussed with Care Management/Social Workerr. Management plans discussed with the patient, family and they are in agreement.  CODE STATUS: dnr  TOTAL TIME TAKING CARE OF THIS PATIENT: 35 minutes.     POSSIBLE D/C IN 1-3 DAYS, DEPENDING ON CLINICAL CONDITION.   Evelena Asa Salary M.D on 09/18/2017   Between 7am to 6pm - Pager - [phone]  After 6pm go to [email or web address] - password [email or web address]  [facility]  Office  [phone]  CC: Primary care physician; Ulice Brilliant, MD  Note: This dictation was prepared with Dragon dictation along with smaller phrase technology. Any transcriptional errors that result from this process are unintentional.

## 2017-09-18 NOTE — Evaluation (Signed)
Physical Therapy Evaluation Patient Details Name: Misty Anderson MRN: [id number] DOB: 04/27/1915 Today's Date: 09/18/2017   History of Present Illness  Pt is a 84 y.o. female presenting to hospital 09/17/17 unresponsive, increased confusion, and fever.  Pt admitted with acute encephalopathy from acute bronchitis, hyponatremia, and acute cystitis.  PMH includes Alzheimer's disease, h/o L hip fx surgery, h/o R knee surgery.  Clinical Impression  Prior to hospital admission, per nursing pt was ambulatory with walker with assist.  Pt lives with family per nurse.  Currently pt is mod assist supine to sit, CGA to min assist with transfers, and min assist (2nd assist for safety) ambulating 30 feet with RW.  Pt would benefit from skilled PT to address noted impairments and functional limitations (see below for any additional details).  Pt has some difficulty following commands; anticipate pt would do better within own home and with familiar assist.  Recommend pt discharge home with 24/7 assist and HHPT.    Follow Up Recommendations Home health PT;Supervision/Assistance - 24 hour    Equipment Recommendations  Rolling walker with 5" wheels    Recommendations for Other Services       Precautions / Restrictions Precautions Precautions: Fall Precaution Comments: Aspiration Restrictions Weight Bearing Restrictions: No      Mobility  Bed Mobility Overal bed mobility: Needs Assistance Bed Mobility: Supine to Sit     Supine to sit: Mod assist;HOB elevated     General bed mobility comments: assist for trunk and B LE's supine to sit  Transfers Overall transfer level: Needs assistance Equipment used: Rolling walker (2 wheeled) Transfers: Sit to/from Stand Sit to Stand: Min guard;Min assist         General transfer comment: pt requiring vc's, tactile cues, and demo to stand up (x2 trials from bed and x2 trials from recliner); assist required to place B UE's on RW and then pt would stand  up  Ambulation/Gait Ambulation/Gait assistance: Min assist;+2 safety/equipment Gait Distance (Feet): 30 Feet Assistive device: Rolling walker (2 wheeled)   Gait velocity: very slow gait cadence   General Gait Details: decreased B step length/foot clearance/heelstrike; pt likes to push walker far in front of her when ambulating requiring assist to bring RW closer to pt  Stairs            Wheelchair Mobility    Modified Rankin (Stroke Patients Only)       Balance Overall balance assessment: Needs assistance Sitting-balance support: No upper extremity supported;Feet supported Sitting balance-Leahy Scale: Good Sitting balance - Comments: steady sitting reaching within BOS   Standing balance support: Bilateral upper extremity supported Standing balance-Leahy Scale: Poor Standing balance comment: requires UE support on RW for static standing balance                             Pertinent Vitals/Pain Pain Assessment: Faces Faces Pain Scale: No hurt Pain Intervention(s): Limited activity within patient's tolerance;Monitored during session;Repositioned  Vitals (HR and O2 on room air) stable and WFL throughout treatment session.  BP 170/70 beginning of session.    Home Living Family/patient expects to be discharged to:: Private residence                 Additional Comments: Per nurse pt lives at home with family.  No family present to give further information.    Prior Function           Comments: Per nurse pt is ambulatory with  walker with assist at home.  No family present to give further information.     Hand Dominance        Extremity/Trunk Assessment   Upper Extremity Assessment Upper Extremity Assessment: Generalized weakness;Difficult to assess due to impaired cognition    Lower Extremity Assessment Lower Extremity Assessment: Generalized weakness;Difficult to assess due to impaired cognition    Cervical / Trunk Assessment Cervical  / Trunk Assessment: Normal  Communication   Communication: Expressive difficulties(Occasionally verbalizing sentences; will nod head to questions.)  Cognition Arousal/Alertness: Awake/alert Behavior During Therapy: WFL for tasks assessed/performed Overall Cognitive Status: (Pt did not state A&O questions when asked)                                        General Comments   Nursing cleared pt for participation in physical therapy; nurse reported that she talked with pt's daughter and pt's daughter requesting physical therapy for pt.  Pt agreeable to PT session.    Exercises     Assessment/Plan    PT Assessment Patient needs continued PT services  PT Problem List Decreased strength;Decreased balance;Decreased mobility;Decreased knowledge of use of DME       PT Treatment Interventions DME instruction;Gait training;Stair training;Functional mobility training;Therapeutic activities;Therapeutic exercise;Balance training;Patient/family education    PT Goals (Current goals can be found in the Care Plan section)  Acute Rehab PT Goals Patient Stated Goal: pt did not state goal when asked PT Goal Formulation: With patient Time For Goal Achievement: 10/02/17 Potential to Achieve Goals: Fair    Frequency Min 2X/week   Barriers to discharge        Co-evaluation               AM-PAC PT "6 Clicks" Daily Activity  Outcome Measure Difficulty turning over in bed (including adjusting bedclothes, sheets and blankets)?: Unable Difficulty moving from lying on back to sitting on the side of the bed? : Unable Difficulty sitting down on and standing up from a chair with arms (e.g., wheelchair, bedside commode, etc,.)?: Unable Help needed moving to and from a bed to chair (including a wheelchair)?: A Little Help needed walking in hospital room?: A Little Help needed climbing 3-5 steps with a railing? : A Lot 6 Click Score: 11    End of Session Equipment Utilized During  Treatment: Gait belt Activity Tolerance: Patient tolerated treatment well Patient left: in chair;with call bell/phone within reach;with chair alarm set Nurse Communication: Mobility status;Precautions;Other (comment)(Purewick removed) PT Visit Diagnosis: Other abnormalities of gait and mobility (R26.89);Muscle weakness (generalized) (M62.81);Difficulty in walking, not elsewhere classified (R26.2)    Time: [id number] PT Time Calculation (min) (ACUTE ONLY): 37 min   Charges:   PT Evaluation $PT Eval Low Complexity: 1 Low PT Treatments $Gait Training: 8-22 mins $Therapeutic Activity: 8-22 mins   PT G CodesHendricks Limes:        Reata Petrov, PT 09/18/17, 5:17 PM [phone]

## 2017-09-18 NOTE — Progress Notes (Signed)
CH presented to the room of Misty Anderson, Misty Anderson,[facility] and was welcomed into the room. Misty Anderson was admitted due to AMS changes subsequent to a UTI. She also has a hx of dementia at baseline. Spoke with patient's daughter, with whom the patient lives with, a very stable and supportive home environment is endorsed, Daughter did not request CH services, but was very gracious.   09/18/17 1300  Clinical Encounter Type  Visited With Patient and family together  Visit Type Initial;Other (Comment)  Referral From  (spoke with gaurdian (daughter) Chaplain services not request)  Consult/Referral To Chaplain  Spiritual Encounters  Spiritual Needs Other (Comment) (support)  Stress Factors  Patient Stress Factors None identified  Family Stress Factors None identified

## 2017-09-19 LAB — RPR: RPR: NONREACTIVE

## 2017-09-19 MED ORDER — ENSURE ENLIVE PO LIQD: 237.0000 mL | Freq: Two times a day (BID) | ORAL | 0 refills | Status: DC

## 2017-09-19 MED ORDER — ENSURE ENLIVE PO LIQD: 237.0000 mL | Freq: Two times a day (BID) | ORAL | Status: DC

## 2017-09-19 MED ORDER — AMLODIPINE BESYLATE 10 MG PO TABS: 10.0000 mg | ORAL_TABLET | Freq: Every day | ORAL | 0 refills | Status: AC

## 2017-09-19 MED ORDER — LOSARTAN POTASSIUM 50 MG PO TABS
50.0000 mg | ORAL_TABLET | Freq: Every day | ORAL | Status: DC
  Administered 2017-09-19: 50 mg via ORAL
  Filled 2017-09-19: qty 1

## 2017-09-19 MED ORDER — CEFDINIR 300 MG PO CAPS: 300.0000 mg | ORAL_CAPSULE | Freq: Two times a day (BID) | ORAL | 0 refills | Status: DC

## 2017-09-19 NOTE — Discharge Summary (Signed)
[facility] - [place] at [facility]   PATIENT NAME: Misty Anderson    MR#:  [id number]  DATE OF BIRTH:  Oct 22, 1915  DATE OF ADMISSION:  09/17/2017 ADMITTING PHYSICIAN: Ramonita Lab, MD  DATE OF DISCHARGE: No discharge date for patient encounter.  PRIMARY CARE PHYSICIAN: Ulice Brilliant, MD    ADMISSION DIAGNOSIS:  History of fever [Z87.898] Altered mental status, unspecified altered mental status type [R41.82] Altered mental status [R41.82]  DISCHARGE DIAGNOSIS:  Active Problems:   Fever   Altered mental status   SECONDARY DIAGNOSIS:   Past Medical History:  Diagnosis Date  . Alzheimer disease     HOSPITAL COURSE:  Misty Anderson y.o.femalewith a known history of Alzheimer's dementia was awake and alert until yesterday and feeding herself is quite altered today. Patient at her baseline talks and walks with the help of a walker. Patient's temperature was at 106 at home and by the time. Patient came into the ED her temperature was 99.8. Patient is arousable and unable to give any history. History is obtained from the patient's daughter.CT head negative chest x-ray negative  *Acute encephalopathy  Resolved Compounded by chronic dementia  Most likely secondary to acute urinary tract infection  Admitted to regular nursing for bed, placed on aspiration/fall precautions, ammonia level was normal, given IV fluids for rehydration, empiric Rocephin for presumed UTI, family did not wish for any aggressive/invasive intervention, MRI of the brain was canceled   *Acute hyponatremia  Resolved with IV fluids for rehydration   *Acute urinary tract infection empiric  Resolving Treated with empiric Rocephin, transition to Omnicef to complete antibiotic course, cultures/sensitivities pending at the time of discharge-patient follow with primary care provider for reevaluation and follow-up of urine culture results  *chronicAlzheimer's  dementia Stable Compounded by extreme age Increased nursing care, aspiration/fall/skin care precautions while in house, family refused palliative care consultation, dietary was consulted while in house   *Acute on chronic malnutrition Dietary consulted  DISCHARGE CONDITIONS:   stable  CONSULTS OBTAINED:    DRUG ALLERGIES:  No Known Allergies  DISCHARGE MEDICATIONS:   Allergies as of 09/19/2017   No Known Allergies     Medication List    TAKE these medications   amLODipine 10 MG tablet Commonly known as:  NORVASC Take 1 tablet (10 mg total) by mouth daily. Only if blood pressure is elevated > 160/90 in home setting Start taking on:  09/20/2017   cefdinir 300 MG capsule Commonly known as:  OMNICEF Take 1 capsule (300 mg total) by mouth 2 (two) times daily.   Cranberry 400 MG Caps Take 1 capsule by mouth daily.   feeding supplement (ENSURE ENLIVE) Liqd Take 237 mLs by mouth 2 (two) times daily between meals.   vitamin C 100 MG tablet Take 1 tablet by mouth daily.        DISCHARGE INSTRUCTIONS:  If you experience worsening of your admission symptoms, develop shortness of breath, life threatening emergency, suicidal or homicidal thoughts you must seek medical attention immediately by calling 911 or calling your MD immediately  if symptoms less severe.  You Must read complete instructions/literature along with all the possible adverse reactions/side effects for all the Medicines you take and that have been prescribed to you. Take any new Medicines after you have completely understood and accept all the possible adverse reactions/side effects.   Please note  You were cared for by a hospitalist during your hospital stay. If you have any questions about your discharge medications or  the care you received while you were in the hospital after you are discharged, you can call the unit and asked to speak with the hospitalist on call if the hospitalist that took care of you  is not available. Once you are discharged, your primary care physician will handle any further medical issues. Please note that NO REFILLS for any discharge medications will be authorized once you are discharged, as it is imperative that you return to your primary care physician (or establish a relationship with a primary care physician if you do not have one) for your aftercare needs so that they can reassess your need for medications and monitor your lab values.    Today   CHIEF COMPLAINT:   Chief Complaint  Patient presents with  . Weakness    HISTORY OF PRESENT ILLNESS:  84 y.o. female with a known history of Alzheimer's dementia was awake and alert until yesterday and feeding herself is  quite altered today.  Patient at her baseline talks and walks with the help of a walker.  Patient's temperature was at 106 at home and by the time.  Patient came into the ED her temperature was 99.8.  Patient is arousable and unable to give any history.  History is obtained from the patient's daughter.  CT head negative chest x-ray negative.  Flu test is pending Patient has been coughing yellowish phlegm according to the patient's daughter for the past few days VITAL SIGNS:  Blood pressure (!) 191/84, pulse 70, temperature 97.7 F (36.5 C), temperature source Oral, resp. rate 18, height 5\' 2"  (1.575 m), weight 52.2 kg (115 lb), SpO2 100 %.  I/O:    Intake/Output Summary (Last 24 hours) at 09/19/2017 1142 Last data filed at 09/18/2017 1844 Gross per 24 hour  Intake 100 ml  Output 300 ml  Net -200 ml    PHYSICAL EXAMINATION:  GENERAL:  84 y.o.-year-old patient lying in the bed with no acute distress.  EYES: Pupils equal, round, reactive to light and accommodation. No scleral icterus. Extraocular muscles intact.  HEENT: Head atraumatic, normocephalic. Oropharynx and nasopharynx clear.  NECK:  Supple, no jugular venous distention. No thyroid enlargement, no tenderness.  LUNGS: Normal breath sounds  bilaterally, no wheezing, rales,rhonchi or crepitation. No use of accessory muscles of respiration.  CARDIOVASCULAR: S1, S2 normal. No murmurs, rubs, or gallops.  ABDOMEN: Soft, non-tender, non-distended. Bowel sounds present. No organomegaly or mass.  EXTREMITIES: No pedal edema, cyanosis, or clubbing.  NEUROLOGIC: Cranial nerves II through XII are intact. Muscle strength 5/5 in all extremities. Sensation intact. Gait not checked.  PSYCHIATRIC: The patient is alert and oriented x 3.  SKIN: No obvious rash, lesion, or ulcer.   DATA REVIEW:   CBC Recent Labs  Lab 09/18/17 0354  WBC 7.5  HGB 11.2*  HCT 33.5*  PLT 116*    Chemistries  Recent Labs  Lab 09/18/17 0354  NA 135  K 3.7  CL 107  CO2 22  GLUCOSE 97  BUN 13  CREATININE 0.57  CALCIUM 7.6*  AST 20  ALT 11  ALKPHOS 52  BILITOT 0.8    Cardiac Enzymes No results for input(s): TROPONINI in the last 168 hours.  Microbiology Results  Results for orders placed or performed during the hospital encounter of 09/17/17  Culture, blood (routine x 2)     Status: None (Preliminary result)   Collection Time: 09/17/17  2:07 PM  Result Value Ref Range Status   Specimen Description BLOOD BLOOD LEFT ARM  Final   Special Requests   Final    BOTTLES DRAWN AEROBIC AND ANAEROBIC Blood Culture adequate volume   Culture   Final    NO GROWTH 2 DAYS Performed at [facility], [address] [place], [place] [address]    Report Status PENDING  Incomplete  Culture, blood (routine x 2)     Status: None (Preliminary result)   Collection Time: 09/17/17  2:07 PM  Result Value Ref Range Status   Specimen Description BLOOD BLOOD RIGHT FOREARM  Final   Special Requests   Final    BOTTLES DRAWN AEROBIC AND ANAEROBIC Blood Culture adequate volume   Culture   Final    NO GROWTH 2 DAYS Performed at [facility], [address] [place], [place] [address]    Report Status PENDING  Incomplete  Urine culture     Status:  Abnormal (Preliminary result)   Collection Time: 09/17/17  3:21 PM  Result Value Ref Range Status   Specimen Description   Final    URINE, RANDOM Performed at [facility], [address], [place], [place] [address]    Special Requests   Final    NONE Performed at [facility], [address] [place], [place] [address]    Culture >=100,000 COLONIES/mL ESCHERICHIA COLI (A)  Final   Report Status PENDING  Incomplete  MRSA PCR Screening     Status: None   Collection Time: 09/17/17  6:05 PM  Result Value Ref Range Status   MRSA by PCR NEGATIVE NEGATIVE Final    Comment:        The GeneXpert MRSA Assay (FDA approved for NASAL specimens only), is one component of a comprehensive MRSA colonization surveillance program. It is not intended to diagnose MRSA infection nor to guide or monitor treatment for MRSA infections. Performed at [facility], [address], [place], [place] [address]     RADIOLOGY:  Ct Head Wo Contrast  Result Date: 09/17/2017 CLINICAL DATA:  Altered level of consciousness. EXAM: CT HEAD WITHOUT CONTRAST TECHNIQUE: Contiguous axial images were obtained from the base of the skull through the vertex without intravenous contrast. COMPARISON:  None. FINDINGS: Brain: Moderate to advanced atrophy. Negative for hydrocephalus. Extensive chronic white matter changes. Negative for acute infarct. Negative for hemorrhage mass or fluid collection. Vascular: Negative for hyperdense vessel. Heavily calcified arteries. Skull: Large lytic lesion in the occipital bone in the midline most compatible with a benign lesion such as hemangioma. This has fatty density. Sinuses/Orbits: Mucosal edema in the left sphenoid maxillary and ethmoid sinuses. Negative orbit Other: None IMPRESSION: Moderate to advanced atrophy and chronic microvascular ischemia. No acute intracranial abnormality. Electronically Signed   By: Marlan Palau M.D.   On: 09/17/2017 13:22   Dg  Chest Portable 1 View  Result Date: 09/17/2017 CLINICAL DATA:  Altered mental status. Underlying Alzheimer's disease. Nonsmoker. EXAM: PORTABLE CHEST 1 VIEW COMPARISON:  None in PACs FINDINGS: The lungs are adequately inflated and clear. The heart is top-normal in size. The pulmonary vascularity is normal. There is mural calcification in the thoracic aorta with tortuosity of its ascending and descending components. The mediastinum is grossly normal in width. The bony thorax is unremarkable. IMPRESSION: There is no active cardiopulmonary disease. Thoracic aortic atherosclerosis. Electronically Signed   By: David  [place] M.D.   On: 09/17/2017 13:16    EKG:   Orders placed or performed during the hospital encounter of 09/17/17  . EKG 12-Lead  . EKG 12-Lead  .  ED EKG  . ED EKG  . EKG 12-Lead  . EKG 12-Lead  . EKG      Management plans discussed with the patient, family and they are in agreement.  CODE STATUS:     Code Status Orders  (From admission, onward)        Start     Ordered   09/17/17 1626  Do not attempt resuscitation (DNR)  Continuous    Question Answer Comment  In the event of cardiac or respiratory ARREST Do not call a "code blue"   In the event of cardiac or respiratory ARREST Do not perform Intubation, CPR, defibrillation or ACLS   In the event of cardiac or respiratory ARREST Use medication by any route, position, wound care, and other measures to relive pain and suffering. May use oxygen, suction and manual treatment of airway obstruction as needed for comfort.   Comments RN may pronounce      09/17/17 1626    Code Status History    This patient has a current code status but no historical code status.    Advance Directive Documentation     Most Recent Value  Type of Advance Directive  Healthcare Power of Attorney  Pre-existing out of facility DNR order (yellow form or pink MOST form)  -  "MOST" Form in Place?  -      TOTAL TIME TAKING CARE OF THIS  PATIENT: 45 minutes.    Evelena AsaMontell D Damontay Alred M.D on 09/19/2017 at 11:42 AM  Between 7am to 6pm - Pager - [phone]  After 6pm go to [email or web address] - password [email or web address]  [facility] [place] [facility]  Office  [phone]  CC: Primary care physician; Ulice BrilliantBarnes, Brad, MD   Note: This dictation was prepared with Dragon dictation along with smaller phrase technology. Any transcriptional errors that result from this process are unintentional.

## 2017-09-19 NOTE — Progress Notes (Signed)
Initial Nutrition Assessment  DOCUMENTATION CODES:   Non-severe (moderate) malnutrition in context of chronic illness  INTERVENTION:   - Recommend feeding assistance with all meals  - Ensure Enlive po BID, each supplement provides 350 kcal and 20 grams of protein  NUTRITION DIAGNOSIS:   Moderate Malnutrition related to chronic illness(Alzheimer's dementia) as evidenced by mild fat depletion, moderate fat depletion, mild muscle depletion, moderate muscle depletion, severe muscle depletion.  GOAL:   Patient will meet greater than or equal to 90% of their needs  MONITOR:   PO intake, Weight trends, Labs, Supplement acceptance  REASON FOR ASSESSMENT:   Consult Assessment of nutrition requirement/status  ASSESSMENT:   84 year old female who presented to the ED with weakness. Per intake notes, pt lives at home with family and usually walks with a walker. PMH significant for Alzheimer's dementia.  Noted pt's daughter has refused palliative care team consult.  Attempt to speak with pt at bedside. Pt did not communicate with RD but did open her eyes. No family present at bedside so unable to obtain a detailed weight and diet history. Weight history in chart is limited to stated weights.  RD obtained bed weight at time of visit: 54 kg. However, due to pt having multiple heavy blankets on the bed, suspect pt's weight is lower than 54 kg.  RD noted untouched breakfast meal tray at bedside. RD to order Ensure Enlive BID to maximize energy and protein intake during admission.  Meal Completion: 0%  Medications reviewed.  Labs reviewed: calcium 7.6 (L), hemoglobin 11.2 (L), HCT 33.5 (L)  NUTRITION - FOCUSED PHYSICAL EXAM:    Most Recent Value  Orbital Region  Mild depletion  Upper Arm Region  Moderate depletion  Buccal Region  Mild depletion  Temple Region  Moderate depletion  Clavicle Bone Region  Moderate depletion  Clavicle and Acromion Bone Region  Severe depletion   Scapular Bone Region  Unable to assess  Dorsal Hand  Moderate depletion  Patellar Region  Mild depletion  Anterior Thigh Region  Mild depletion  Posterior Calf Region  Mild depletion  Edema (RD Assessment)  None  Hair  Reviewed  Eyes  Reviewed  Mouth  Unable to assess  Skin  Reviewed  Nails  Reviewed       Diet Order:   Diet Order           Diet regular Room service appropriate? Yes; Fluid consistency: Thin  Diet effective now          EDUCATION NEEDS:   Not appropriate for education at this time  Skin:  Skin Assessment: Reviewed RN Assessment  Last BM:  09/18/17 medium type 6  Height:   Ht Readings from Last 1 Encounters:  09/17/17 5\' 2"  (1.575 m)    Weight:   Wt Readings from Last 1 Encounters:  09/17/17 115 lb (52.2 kg)    Ideal Body Weight:  50 kg  BMI:  Body mass index is 21.03 kg/m.  Estimated Nutritional Needs:   Kcal:  1250-1450 kcal/day  Protein:  60-75 grams/day  Fluid:  >/= 1.3 L/day    Earma ReadingKate Jablonski Ermin Parisien, MS, RD, LDN Pager: [phone] Weekend/After Hours: [phone]

## 2017-09-19 NOTE — Care Management Note (Signed)
Case Management Note  Patient Details  Name: Misty Anderson MRN: [id number] Date of Birth: Apr 28, 1915  Subjective/Objective:  Spoke with daughter, Misty Anderson. She states patient lives with her. She ambulates with a walker. HX of Dementia. Discharging home by car with her daughter today. PCP is Dr. Zachery DauerBarnes. PT recommending home health PT. Patient would also benefit from from a RN and SW. Daughter declined a HHA because she has a hired caregiver Monday through Friday excluding Thursday. Offered home health agencies.[facility]erral to [facility] for RN, PT and SW. Daughter reports caregiver stress.                    Action/Plan: [facility] for HHPT, RN and SW.   Expected Discharge Date:  09/19/17               Expected Discharge Plan:  Home w Home Health Services  In-House Referral:     Discharge planning Services  CM Consult  Post Acute Care Choice:  Home Health Choice offered to:  Adult Children  DME Arranged:    DME Agency:     HH Arranged:  RN, PT, Social Work [employer] [facility]:  [facility]  Status of Service:  Completed, signed off  If discussed at [employer] Meetings, dates discussed:    Additional Comments:  Misty Anderson Misty Sobocinski, RN 09/19/2017, 12:02 PM

## 2017-09-19 NOTE — Progress Notes (Signed)
Patient is being discharged home as per order, discharge instruction provided to POA at bedside, , iv removed , patient discharged home

## 2017-09-20 LAB — URINE CULTURE: Culture: >=100000 — AB

## 2017-09-22 LAB — CULTURE, BLOOD (ROUTINE X 2)
CULTURE: NO GROWTH
CULTURE: NO GROWTH
SPECIAL REQUESTS: ADEQUATE
SPECIAL REQUESTS: ADEQUATE

## 2017-09-28 DIAGNOSIS — R270 Ataxia, unspecified: Secondary | ICD-10-CM | POA: Diagnosis not present

## 2017-09-28 DIAGNOSIS — N390 Urinary tract infection, site not specified: Secondary | ICD-10-CM | POA: Diagnosis not present

## 2017-09-28 DIAGNOSIS — F0280 Dementia in other diseases classified elsewhere without behavioral disturbance: Secondary | ICD-10-CM | POA: Diagnosis not present

## 2017-09-28 DIAGNOSIS — R5381 Other malaise: Secondary | ICD-10-CM | POA: Diagnosis not present

## 2017-09-28 DIAGNOSIS — G309 Alzheimer's disease, unspecified: Secondary | ICD-10-CM | POA: Diagnosis not present

## 2017-09-28 DIAGNOSIS — E43 Unspecified severe protein-calorie malnutrition: Secondary | ICD-10-CM | POA: Diagnosis not present

## 2017-09-28 DIAGNOSIS — J209 Acute bronchitis, unspecified: Secondary | ICD-10-CM | POA: Diagnosis not present

## 2017-10-01 DIAGNOSIS — R270 Ataxia, unspecified: Secondary | ICD-10-CM | POA: Diagnosis not present

## 2017-10-01 DIAGNOSIS — E43 Unspecified severe protein-calorie malnutrition: Secondary | ICD-10-CM | POA: Diagnosis not present

## 2017-10-01 DIAGNOSIS — F0280 Dementia in other diseases classified elsewhere without behavioral disturbance: Secondary | ICD-10-CM | POA: Diagnosis not present

## 2017-10-01 DIAGNOSIS — N390 Urinary tract infection, site not specified: Secondary | ICD-10-CM | POA: Diagnosis not present

## 2017-10-01 DIAGNOSIS — G309 Alzheimer's disease, unspecified: Secondary | ICD-10-CM | POA: Diagnosis not present

## 2017-10-01 DIAGNOSIS — J209 Acute bronchitis, unspecified: Secondary | ICD-10-CM | POA: Diagnosis not present

## 2017-10-03 DIAGNOSIS — E43 Unspecified severe protein-calorie malnutrition: Secondary | ICD-10-CM | POA: Diagnosis not present

## 2017-10-03 DIAGNOSIS — J209 Acute bronchitis, unspecified: Secondary | ICD-10-CM | POA: Diagnosis not present

## 2017-10-03 DIAGNOSIS — F0280 Dementia in other diseases classified elsewhere without behavioral disturbance: Secondary | ICD-10-CM | POA: Diagnosis not present

## 2017-10-03 DIAGNOSIS — R270 Ataxia, unspecified: Secondary | ICD-10-CM | POA: Diagnosis not present

## 2017-10-03 DIAGNOSIS — N390 Urinary tract infection, site not specified: Secondary | ICD-10-CM | POA: Diagnosis not present

## 2017-10-03 DIAGNOSIS — G309 Alzheimer's disease, unspecified: Secondary | ICD-10-CM | POA: Diagnosis not present

## 2017-10-05 DIAGNOSIS — G309 Alzheimer's disease, unspecified: Secondary | ICD-10-CM | POA: Diagnosis not present

## 2017-10-05 DIAGNOSIS — J209 Acute bronchitis, unspecified: Secondary | ICD-10-CM | POA: Diagnosis not present

## 2017-10-05 DIAGNOSIS — N390 Urinary tract infection, site not specified: Secondary | ICD-10-CM | POA: Diagnosis not present

## 2017-10-05 DIAGNOSIS — E43 Unspecified severe protein-calorie malnutrition: Secondary | ICD-10-CM | POA: Diagnosis not present

## 2017-10-05 DIAGNOSIS — R270 Ataxia, unspecified: Secondary | ICD-10-CM | POA: Diagnosis not present

## 2017-10-05 DIAGNOSIS — F0280 Dementia in other diseases classified elsewhere without behavioral disturbance: Secondary | ICD-10-CM | POA: Diagnosis not present

## 2017-10-10 DIAGNOSIS — E43 Unspecified severe protein-calorie malnutrition: Secondary | ICD-10-CM | POA: Diagnosis not present

## 2017-10-10 DIAGNOSIS — R270 Ataxia, unspecified: Secondary | ICD-10-CM | POA: Diagnosis not present

## 2017-10-10 DIAGNOSIS — J209 Acute bronchitis, unspecified: Secondary | ICD-10-CM | POA: Diagnosis not present

## 2017-10-10 DIAGNOSIS — F0280 Dementia in other diseases classified elsewhere without behavioral disturbance: Secondary | ICD-10-CM | POA: Diagnosis not present

## 2017-10-10 DIAGNOSIS — N390 Urinary tract infection, site not specified: Secondary | ICD-10-CM | POA: Diagnosis not present

## 2017-10-10 DIAGNOSIS — G309 Alzheimer's disease, unspecified: Secondary | ICD-10-CM | POA: Diagnosis not present

## 2017-10-12 DIAGNOSIS — G309 Alzheimer's disease, unspecified: Secondary | ICD-10-CM | POA: Diagnosis not present

## 2017-10-12 DIAGNOSIS — F0280 Dementia in other diseases classified elsewhere without behavioral disturbance: Secondary | ICD-10-CM | POA: Diagnosis not present

## 2017-10-12 DIAGNOSIS — J209 Acute bronchitis, unspecified: Secondary | ICD-10-CM | POA: Diagnosis not present

## 2017-10-12 DIAGNOSIS — E43 Unspecified severe protein-calorie malnutrition: Secondary | ICD-10-CM | POA: Diagnosis not present

## 2017-10-12 DIAGNOSIS — N390 Urinary tract infection, site not specified: Secondary | ICD-10-CM | POA: Diagnosis not present

## 2017-10-12 DIAGNOSIS — R270 Ataxia, unspecified: Secondary | ICD-10-CM | POA: Diagnosis not present

## 2017-10-15 DIAGNOSIS — J209 Acute bronchitis, unspecified: Secondary | ICD-10-CM | POA: Diagnosis not present

## 2017-10-15 DIAGNOSIS — F0280 Dementia in other diseases classified elsewhere without behavioral disturbance: Secondary | ICD-10-CM | POA: Diagnosis not present

## 2017-10-15 DIAGNOSIS — R270 Ataxia, unspecified: Secondary | ICD-10-CM | POA: Diagnosis not present

## 2017-10-15 DIAGNOSIS — G309 Alzheimer's disease, unspecified: Secondary | ICD-10-CM | POA: Diagnosis not present

## 2017-10-15 DIAGNOSIS — E43 Unspecified severe protein-calorie malnutrition: Secondary | ICD-10-CM | POA: Diagnosis not present

## 2017-10-15 DIAGNOSIS — N390 Urinary tract infection, site not specified: Secondary | ICD-10-CM | POA: Diagnosis not present

## 2017-10-16 DIAGNOSIS — R270 Ataxia, unspecified: Secondary | ICD-10-CM | POA: Diagnosis not present

## 2017-10-16 DIAGNOSIS — F0280 Dementia in other diseases classified elsewhere without behavioral disturbance: Secondary | ICD-10-CM | POA: Diagnosis not present

## 2017-10-16 DIAGNOSIS — E43 Unspecified severe protein-calorie malnutrition: Secondary | ICD-10-CM | POA: Diagnosis not present

## 2017-10-16 DIAGNOSIS — J209 Acute bronchitis, unspecified: Secondary | ICD-10-CM | POA: Diagnosis not present

## 2017-10-16 DIAGNOSIS — G309 Alzheimer's disease, unspecified: Secondary | ICD-10-CM | POA: Diagnosis not present

## 2017-10-16 DIAGNOSIS — N390 Urinary tract infection, site not specified: Secondary | ICD-10-CM | POA: Diagnosis not present

## 2017-10-22 DIAGNOSIS — N390 Urinary tract infection, site not specified: Secondary | ICD-10-CM | POA: Diagnosis not present

## 2017-10-22 DIAGNOSIS — G309 Alzheimer's disease, unspecified: Secondary | ICD-10-CM | POA: Diagnosis not present

## 2017-10-22 DIAGNOSIS — F0280 Dementia in other diseases classified elsewhere without behavioral disturbance: Secondary | ICD-10-CM | POA: Diagnosis not present

## 2017-10-22 DIAGNOSIS — R270 Ataxia, unspecified: Secondary | ICD-10-CM | POA: Diagnosis not present

## 2017-10-22 DIAGNOSIS — J209 Acute bronchitis, unspecified: Secondary | ICD-10-CM | POA: Diagnosis not present

## 2017-10-22 DIAGNOSIS — E43 Unspecified severe protein-calorie malnutrition: Secondary | ICD-10-CM | POA: Diagnosis not present

## 2018-03-23 ENCOUNTER — Ambulatory Visit
Admission: EM | Admit: 2018-03-23 | Discharge: 2018-03-23 | Disposition: A | Discharge status: Home / Self Care | Payer: Medicare Other | Attending: Family Medicine | Admitting: Family Medicine

## 2018-03-23 ENCOUNTER — Encounter: Payer: Self-pay | Admitting: Gynecology

## 2018-03-23 DIAGNOSIS — H1033 Unspecified acute conjunctivitis, bilateral: Secondary | ICD-10-CM | POA: Insufficient documentation

## 2018-03-23 DIAGNOSIS — H579 Unspecified disorder of eye and adnexa: Secondary | ICD-10-CM | POA: Diagnosis not present

## 2018-03-23 MED ORDER — POLYMYXIN B-TRIMETHOPRIM 10000-0.1 UNIT/ML-% OP SOLN: 1.0000 [drp] | Freq: Four times a day (QID) | OPHTHALMIC | 0 refills | Status: AC

## 2018-03-23 NOTE — ED Triage Notes (Addendum)
Per daughter , her mom with bilateral  Eye discharge x 3-4 days. Per mom very lethargic and very sleepy at all times.

## 2018-03-23 NOTE — ED Provider Notes (Signed)
[facility]    CSN: [id number] Arrival date & time: 03/23/18  1433  History   Chief Complaint Eye discharge  HPI  84 year old female presents for evaluation of the above.  History is obtained by the daughter.  Patient has advanced Alzheimer's disease and is not particularly communicative.  Daughter states that the past 3 to 4 days she has noticed white eye discharge.  Her left eye has been red.  Per the daughter, she has been more lethargic and sleepy.  However, she endorses good appetite.  No reports of pain.  She has administered Visine eyedrops without resolution.  No other reported symptoms.  No other complaints.  PMH, Surgical Hx, Family Hx, Social History reviewed and updated as below.  Past Medical History:  Diagnosis Date  . Alzheimer disease (HCC)   HTN  Patient Active Problem List   Diagnosis Date Noted  . Fever 09/17/2017  . Altered mental status 09/17/2017   Past Surgical History:  Procedure Laterality Date  . HIP FRACTURE SURGERY Left 05/2014  . KNEE SURGERY Right 1984    OB History   No obstetric history on file.    Home Medications    Prior to Admission medications   Medication Sig Start Date End Date Taking? Authorizing Provider  amLODipine (NORVASC) 10 MG tablet Take 1 tablet (10 mg total) by mouth daily. Only if blood pressure is elevated > 160/90 in home setting 09/20/17  Yes Salary, Montell D, MD  Ascorbic Acid (VITAMIN C) 100 MG tablet Take 1 tablet by mouth daily.   Yes [provider]  trimethoprim-polymyxin b (POLYTRIM) ophthalmic solution Place 1 drop into both eyes every 6 (six) hours for 5 days. 03/23/18 03/28/18  Tommie Samsook, Venetta Knee G, DO   Family History Family History  Problem Relation Age of Onset  . Alzheimer's disease Mother   . Cancer Father    Social History Social History   Tobacco Use  . Smoking status: Never Smoker  . Smokeless tobacco: Never Used  Substance Use Topics  . Alcohol use: No    Frequency: Never    . Drug use: No     Allergies   Patient has no known allergies.   Review of Systems Review of Systems  Constitutional: Positive for fatigue. Negative for appetite change.  Eyes: Positive for discharge and redness.   Physical Exam Triage Vital Signs ED Triage Vitals  Enc Vitals Group     BP 03/23/18 1509 (!) 162/79     Pulse Rate 03/23/18 1509 70     Resp 03/23/18 1509 16     Temp 03/23/18 1509 (!) 97.4 F (36.3 C)     Temp Source 03/23/18 1509 Axillary     SpO2 03/23/18 1509 100 %     Weight 03/23/18 1514 115 lb (52.2 kg)     Height --      Head Circumference --      Peak Flow --      Pain Score 03/23/18 1514 0     Pain Loc --      Pain Edu? --      Excl. in GC? --    Updated Vital Signs BP (!) 162/79 (BP Location: Left Arm)   Pulse 70   Temp (!) 97.4 F (36.3 C) (Axillary)   Resp 16   Wt 52.2 kg   SpO2 100%   BMI 21.03 kg/m   Visual Acuity Right Eye Distance:   Left Eye Distance:   Bilateral Distance:  Right Eye Near:   Left Eye Near:    Bilateral Near:     Physical Exam Vitals signs and nursing note reviewed.  Constitutional:      General: She is not in acute distress.    Comments: Awake but not alert.  At baseline per daughter.  HENT:     Head: Normocephalic and atraumatic.     Nose: Nose normal.  Eyes:     Comments: Left eye with conjunctival injection.  Bilateral white eye discharge.  Cardiovascular:     Rate and Rhythm: Normal rate and regular rhythm.  Pulmonary:     Effort: Pulmonary effort is normal.     Breath sounds: No wheezing, rhonchi or rales.  Abdominal:     General: There is no distension.     Palpations: Abdomen is soft.  Musculoskeletal:     Left lower leg: Left lower leg edema:     UC Treatments / Results  Labs (all labs ordered are listed, but only abnormal results are displayed) Labs Reviewed - No data to display  EKG None  Radiology No results found.  Procedures Procedures (including critical care  time)  Medications Ordered in UC Medications - No data to display  Initial Impression / Assessment and Plan / UC Course  I have reviewed the triage vital signs and the nursing notes.  Pertinent labs & imaging results that were available during my care of the patient were reviewed by me and considered in my medical decision making (see chart for details).    84 year old female presents with suspected conjunctivitis.  Treating with Polytrim.  Advised to see [facility] if she feels improve or worsens.   Final Clinical Impressions(s) / UC Diagnoses   Final diagnoses:  Acute conjunctivitis of both eyes, unspecified acute conjunctivitis type     Discharge Instructions     Antibiotic eyedrops as prescribed.  It does not improve, please have her see [facility].  Take care  Dr. Adriana Simasook   ED Prescriptions    Medication Sig Dispense Auth. Provider   trimethoprim-polymyxin b (POLYTRIM) ophthalmic solution Place 1 drop into both eyes every 6 (six) hours for 5 days. 10 mL Tommie Samsook, Tayshon Winker G, DO     Controlled Substance Prescriptions [place] Controlled Substance Registry consulted? Not Applicable   Tommie SamsCook, Fayetta Sorenson G, DO 03/23/18 [id number]

## 2018-03-23 NOTE — Discharge Instructions (Signed)
Antibiotic eyedrops as prescribed.  It does not improve, please have her see [facility].  Take care  Dr. Adriana Simasook

## 2018-10-22 ENCOUNTER — Telehealth: Payer: Self-pay

## 2018-10-22 NOTE — Telephone Encounter (Signed)
Telephone call to patient's daughter Earlie Server.  RN called both home number [phone] and cell [phone].  RN left message on Dorothy;s cell requesting call back to schedule time for palliative care team to meet to explain palliative care services.

## 2018-10-22 NOTE — Telephone Encounter (Signed)
Telephone call from daughter Earlie Server to schedule palliative care visit.  Daughter requests palliative team visit on Thursday at 1:30 PM.

## 2018-10-24 ENCOUNTER — Other Ambulatory Visit: Payer: Medicare Other

## 2018-10-24 ENCOUNTER — Other Ambulatory Visit: Payer: Self-pay

## 2018-10-24 DIAGNOSIS — Z515 Encounter for palliative care: Secondary | ICD-10-CM

## 2018-10-24 NOTE — Progress Notes (Signed)
COMMUNITY PALLIATIVE CARE SW NOTE  PATIENT NAME: Misty Anderson DOB: [birth year] MRN: [id number]  PRIMARY CARE PROVIDER: Abran Duke, MD  RESPONSIBLE PARTY:  Acct ID - Guarantor Home Phone Work Phone Relationship Acct Type  [id number] KHYA, HALLS [phone]  Self P/F     [address] [place], [place] [address]     PLAN OF CARE and INTERVENTIONS:             1. GOALS OF CARE/ ADVANCE CARE PLANNING:  Patient's daughter has guardianship of patient. Patient is a DNR, does not have a DNR form in the home. Goals are to remain at home with quality of life and be comfortable.  2. SOCIAL/EMOTIONAL/SPIRITUAL ASSESSMENT/ INTERVENTIONS:  SW and RN met with patient, Misty Anderson (patient's daughter) and paid caregiver in the home. Patient was sitting in chair, asleep when team arrived. Patient did not respond to team. Misty Anderson and caregiver provided brief history on patient and recent decline. Patient is sleeping more (~15 hours/day) per Misty Anderson. Misty Anderson said patient is slower with eating and has to be fed. Misty Anderson said that they try to maintain a routine and keep patient scheduled as possible. Misty Anderson also believes that patient has a UTI due to smell and color, RN to request order for sample from MD. Misty Anderson has cared for patient for 8 years. Patient is a retired Nurse, mental health. Patient moved in with Misty Anderson from [place] when she started to show signs of dementia. Patient has a son that still lives in [place], [place]. and is supportive of Misty Anderson's decisions for patient's care. 3. PATIENT/CAREGIVER EDUCATION/ COPING:  Misty Anderson discussed and processed the difficulty with care decisions. Misty Anderson said that her father was on hospice services with a different agency and that they had a negative experience with his service and felt that he "starved to death". Team validated Misty Anderson's feelings and provided emotional support. Misty Anderson acknowledged patient's age and the impact of aging on the body and mind. Misty Anderson is realistic  with goals of care, and desires for patient to have quality of life. 4. PERSONAL EMERGENCY PLAN:  Family/caregiver will call 9-1-1 for emergencies. Misty Anderson said patient has not been out of the house due to COVID-19 and they are limiting contacts. 5. COMMUNITY RESOURCES COORDINATION/ HEALTH CARE NAVIGATION:  Misty Anderson has a paid caregiver daily from 11a-3p. Misty Anderson manages all of patent's care. 6. FINANCIAL/LEGAL CONCERNS/INTERVENTIONS:  None.     SOCIAL HX:  Social History   Tobacco Use  . Smoking status: Never Smoker  . Smokeless tobacco: Never Used  Substance Use Topics  . Alcohol use: No    Frequency: Never    CODE STATUS:   Code Status: Prior (DNR - needs form) ADVANCED DIRECTIVES: N MOST FORM COMPLETE:  No. HOSPICE EDUCATION PROVIDED: Yes, thorough education provided.   PPS: Patient is dependent on family/caregiver of ADLs.  Due to patient's recent decline, a referral will be made to hospice services at this time. Family agreeable to plan, RN to coordinate with MD and Immunologist.   I spent 60 minutes with patient/family, from 1:30-2:30p providing education, support and consultation.    Margaretmary Lombard, LCSW

## 2018-10-24 NOTE — Progress Notes (Signed)
PATIENT NAME: Misty Anderson DOB: January 28, 1916 MRN: [id number]  PRIMARY CARE PROVIDER: Abran Duke, MD  RESPONSIBLE PARTY:  Acct ID - Guarantor Home Phone Work Phone Relationship Acct Type  [id number] Misty Anderson [phone]  Self P/F     [address] [place], [place] [address]    PLAN OF CARE and INTERVENTIONS:               1.  GOALS OF CARE/ ADVANCE CARE PLANNING:  Daughter wants patient to remain at home and be comfortable.                2.  PATIENT/CAREGIVER EDUCATION:  Education on disease progression, education on aspiration precautions, education on hospice care.               3.  DISEASE STATUS: SW and RN met with patient's daughter Misty Anderson and hired caregiver.  Patient is a 89 year of patient of Dr Misty Anderson. Daughter and caregiver report patient has had a significant decline within the last 2 months.  Patient is sleeping most of the time, lost muscle mass, unable to ambulate, having UTI's, increased congestion, aspiration and overall decline in functional status.  Daughter reports she moved patient and her father in to live with her and her husband 8 years ago.  Patient sitting up in chair, hired caregiver reports it took her 2 hours to feed patient lunch today.  Caregiver and daughter have been using syringe to get liquids in patient.  Patient leaning forward and is asleep during SW and RN's visit.  Caregiver gets patient up and takes patient to the toilet.  Daughter states patient is incontinent at night.  Patient does not know daughter but will sometimes say her name "Misty Anderson."  Patient is not taking any prescription medications.  Patient coughs with drinking and daughter feels patient is aspirating.  Daughter wants to continue feeding patient and giving patient liquids.  Daughter does not want patient to have a feeding tube.  Patient appears comfortable.  Caregiver reports patient's urine is dark and fowl smelling.  Daughter would like UA & CNS done on patient.  Patient was hospitalized in June 2019 for  a UTI.  Daughter has guardianship of patient. Daughter does not want patient to receive CPR but does not have a DNR in the home. SW and RN provide education to daughter on disease progression and decline. Palliative team feels patient is hospice appropriate.  Daughter had bad experience with another hospice as she was informed not to feed her father or give antibiotics.  Education to daughter food and some types of antibiotics are looked at as comfort.  Daughter in agreement with hospice evalation for patient. RN calls medical director Dr Misty Anderson and Dr Misty Anderson in agreement with hospice evaluation for patient.  RN calls Dr Misty Anderson office and requests order for hospice evaluation for patient as well as order for UA & CNS.  Daughter informed referral intake would contact her to schedule time to meet with her to discuss hospice services.  Daughter and caregiver informed to call with questions or concerns.                     HISTORY OF PRESENT ILLNESS:    CODE STATUS: No Code, DNR not in the home.  ADVANCED DIRECTIVES: No MOST FORM: No PPS: 30%   PHYSICAL EXAM:   VITALS: See vital signs   LUNGS: decreased breath sounds CARDIAC: Cor RRR  EXTREMITIES: none edema SKIN: Skin color, texture, turgor  normal. No rashes or lesions  NEURO: positive for gait problems, memory problems, speech problems and weakness       Misty Simmer, RN

## 2018-10-25 DIAGNOSIS — F0280 Dementia in other diseases classified elsewhere without behavioral disturbance: Secondary | ICD-10-CM | POA: Diagnosis not present

## 2018-10-25 DIAGNOSIS — Z741 Need for assistance with personal care: Secondary | ICD-10-CM | POA: Diagnosis not present

## 2018-10-25 DIAGNOSIS — I10 Essential (primary) hypertension: Secondary | ICD-10-CM | POA: Diagnosis not present

## 2018-10-25 DIAGNOSIS — R627 Adult failure to thrive: Secondary | ICD-10-CM | POA: Diagnosis not present

## 2018-10-25 DIAGNOSIS — R1310 Dysphagia, unspecified: Secondary | ICD-10-CM | POA: Diagnosis not present

## 2018-10-25 DIAGNOSIS — G309 Alzheimer's disease, unspecified: Secondary | ICD-10-CM | POA: Diagnosis not present

## 2018-10-25 DIAGNOSIS — S72002D Fracture of unspecified part of neck of left femur, subsequent encounter for closed fracture with routine healing: Secondary | ICD-10-CM | POA: Diagnosis not present

## 2018-10-25 DIAGNOSIS — Z87440 Personal history of urinary (tract) infections: Secondary | ICD-10-CM | POA: Diagnosis not present

## 2018-10-25 DIAGNOSIS — Z6820 Body mass index (BMI) 20.0-20.9, adult: Secondary | ICD-10-CM | POA: Diagnosis not present

## 2018-10-26 DIAGNOSIS — I10 Essential (primary) hypertension: Secondary | ICD-10-CM | POA: Diagnosis not present

## 2018-10-26 DIAGNOSIS — Z741 Need for assistance with personal care: Secondary | ICD-10-CM | POA: Diagnosis not present

## 2018-10-26 DIAGNOSIS — F0280 Dementia in other diseases classified elsewhere without behavioral disturbance: Secondary | ICD-10-CM | POA: Diagnosis not present

## 2018-10-26 DIAGNOSIS — R1310 Dysphagia, unspecified: Secondary | ICD-10-CM | POA: Diagnosis not present

## 2018-10-26 DIAGNOSIS — R627 Adult failure to thrive: Secondary | ICD-10-CM | POA: Diagnosis not present

## 2018-10-26 DIAGNOSIS — Z6820 Body mass index (BMI) 20.0-20.9, adult: Secondary | ICD-10-CM | POA: Diagnosis not present

## 2018-10-26 DIAGNOSIS — S72002D Fracture of unspecified part of neck of left femur, subsequent encounter for closed fracture with routine healing: Secondary | ICD-10-CM | POA: Diagnosis not present

## 2018-10-26 DIAGNOSIS — G309 Alzheimer's disease, unspecified: Secondary | ICD-10-CM | POA: Diagnosis not present

## 2018-10-26 DIAGNOSIS — Z87440 Personal history of urinary (tract) infections: Secondary | ICD-10-CM | POA: Diagnosis not present

## 2018-10-28 DIAGNOSIS — R1310 Dysphagia, unspecified: Secondary | ICD-10-CM | POA: Diagnosis not present

## 2018-10-28 DIAGNOSIS — I10 Essential (primary) hypertension: Secondary | ICD-10-CM | POA: Diagnosis not present

## 2018-10-28 DIAGNOSIS — R627 Adult failure to thrive: Secondary | ICD-10-CM | POA: Diagnosis not present

## 2018-10-28 DIAGNOSIS — Z87440 Personal history of urinary (tract) infections: Secondary | ICD-10-CM | POA: Diagnosis not present

## 2018-10-28 DIAGNOSIS — G309 Alzheimer's disease, unspecified: Secondary | ICD-10-CM | POA: Diagnosis not present

## 2018-10-28 DIAGNOSIS — F0280 Dementia in other diseases classified elsewhere without behavioral disturbance: Secondary | ICD-10-CM | POA: Diagnosis not present

## 2018-10-29 DIAGNOSIS — F0280 Dementia in other diseases classified elsewhere without behavioral disturbance: Secondary | ICD-10-CM | POA: Diagnosis not present

## 2018-10-29 DIAGNOSIS — I10 Essential (primary) hypertension: Secondary | ICD-10-CM | POA: Diagnosis not present

## 2018-10-29 DIAGNOSIS — G309 Alzheimer's disease, unspecified: Secondary | ICD-10-CM | POA: Diagnosis not present

## 2018-10-29 DIAGNOSIS — Z87440 Personal history of urinary (tract) infections: Secondary | ICD-10-CM | POA: Diagnosis not present

## 2018-10-29 DIAGNOSIS — R1310 Dysphagia, unspecified: Secondary | ICD-10-CM | POA: Diagnosis not present

## 2018-10-29 DIAGNOSIS — R627 Adult failure to thrive: Secondary | ICD-10-CM | POA: Diagnosis not present

## 2018-11-04 DIAGNOSIS — G309 Alzheimer's disease, unspecified: Secondary | ICD-10-CM | POA: Diagnosis not present

## 2018-11-04 DIAGNOSIS — Z87440 Personal history of urinary (tract) infections: Secondary | ICD-10-CM | POA: Diagnosis not present

## 2018-11-04 DIAGNOSIS — I10 Essential (primary) hypertension: Secondary | ICD-10-CM | POA: Diagnosis not present

## 2018-11-04 DIAGNOSIS — R1310 Dysphagia, unspecified: Secondary | ICD-10-CM | POA: Diagnosis not present

## 2018-11-04 DIAGNOSIS — R627 Adult failure to thrive: Secondary | ICD-10-CM | POA: Diagnosis not present

## 2018-11-04 DIAGNOSIS — F0280 Dementia in other diseases classified elsewhere without behavioral disturbance: Secondary | ICD-10-CM | POA: Diagnosis not present

## 2018-11-08 DIAGNOSIS — G309 Alzheimer's disease, unspecified: Secondary | ICD-10-CM | POA: Diagnosis not present

## 2018-11-08 DIAGNOSIS — Z87440 Personal history of urinary (tract) infections: Secondary | ICD-10-CM | POA: Diagnosis not present

## 2018-11-08 DIAGNOSIS — I10 Essential (primary) hypertension: Secondary | ICD-10-CM | POA: Diagnosis not present

## 2018-11-08 DIAGNOSIS — R1310 Dysphagia, unspecified: Secondary | ICD-10-CM | POA: Diagnosis not present

## 2018-11-08 DIAGNOSIS — F0280 Dementia in other diseases classified elsewhere without behavioral disturbance: Secondary | ICD-10-CM | POA: Diagnosis not present

## 2018-11-08 DIAGNOSIS — R627 Adult failure to thrive: Secondary | ICD-10-CM | POA: Diagnosis not present

## 2018-11-15 DIAGNOSIS — I10 Essential (primary) hypertension: Secondary | ICD-10-CM | POA: Diagnosis not present

## 2018-11-15 DIAGNOSIS — Z87440 Personal history of urinary (tract) infections: Secondary | ICD-10-CM | POA: Diagnosis not present

## 2018-11-15 DIAGNOSIS — R1310 Dysphagia, unspecified: Secondary | ICD-10-CM | POA: Diagnosis not present

## 2018-11-15 DIAGNOSIS — F0280 Dementia in other diseases classified elsewhere without behavioral disturbance: Secondary | ICD-10-CM | POA: Diagnosis not present

## 2018-11-15 DIAGNOSIS — G309 Alzheimer's disease, unspecified: Secondary | ICD-10-CM | POA: Diagnosis not present

## 2018-11-15 DIAGNOSIS — R627 Adult failure to thrive: Secondary | ICD-10-CM | POA: Diagnosis not present

## 2018-11-22 DIAGNOSIS — R627 Adult failure to thrive: Secondary | ICD-10-CM | POA: Diagnosis not present

## 2018-11-22 DIAGNOSIS — R1310 Dysphagia, unspecified: Secondary | ICD-10-CM | POA: Diagnosis not present

## 2018-11-22 DIAGNOSIS — Z87440 Personal history of urinary (tract) infections: Secondary | ICD-10-CM | POA: Diagnosis not present

## 2018-11-22 DIAGNOSIS — F0280 Dementia in other diseases classified elsewhere without behavioral disturbance: Secondary | ICD-10-CM | POA: Diagnosis not present

## 2018-11-22 DIAGNOSIS — I10 Essential (primary) hypertension: Secondary | ICD-10-CM | POA: Diagnosis not present

## 2018-11-22 DIAGNOSIS — G309 Alzheimer's disease, unspecified: Secondary | ICD-10-CM | POA: Diagnosis not present

## 2018-11-26 DIAGNOSIS — S72002D Fracture of unspecified part of neck of left femur, subsequent encounter for closed fracture with routine healing: Secondary | ICD-10-CM | POA: Diagnosis not present

## 2018-11-26 DIAGNOSIS — R1310 Dysphagia, unspecified: Secondary | ICD-10-CM | POA: Diagnosis not present

## 2018-11-26 DIAGNOSIS — R627 Adult failure to thrive: Secondary | ICD-10-CM | POA: Diagnosis not present

## 2018-11-26 DIAGNOSIS — I10 Essential (primary) hypertension: Secondary | ICD-10-CM | POA: Diagnosis not present

## 2018-11-26 DIAGNOSIS — Z87440 Personal history of urinary (tract) infections: Secondary | ICD-10-CM | POA: Diagnosis not present

## 2018-11-26 DIAGNOSIS — Z6820 Body mass index (BMI) 20.0-20.9, adult: Secondary | ICD-10-CM | POA: Diagnosis not present

## 2018-11-26 DIAGNOSIS — G309 Alzheimer's disease, unspecified: Secondary | ICD-10-CM | POA: Diagnosis not present

## 2018-11-26 DIAGNOSIS — F0280 Dementia in other diseases classified elsewhere without behavioral disturbance: Secondary | ICD-10-CM | POA: Diagnosis not present

## 2018-11-26 DIAGNOSIS — Z741 Need for assistance with personal care: Secondary | ICD-10-CM | POA: Diagnosis not present

## 2018-11-29 DIAGNOSIS — I10 Essential (primary) hypertension: Secondary | ICD-10-CM | POA: Diagnosis not present

## 2018-11-29 DIAGNOSIS — Z87440 Personal history of urinary (tract) infections: Secondary | ICD-10-CM | POA: Diagnosis not present

## 2018-11-29 DIAGNOSIS — F0280 Dementia in other diseases classified elsewhere without behavioral disturbance: Secondary | ICD-10-CM | POA: Diagnosis not present

## 2018-11-29 DIAGNOSIS — R627 Adult failure to thrive: Secondary | ICD-10-CM | POA: Diagnosis not present

## 2018-11-29 DIAGNOSIS — R1310 Dysphagia, unspecified: Secondary | ICD-10-CM | POA: Diagnosis not present

## 2018-11-29 DIAGNOSIS — G309 Alzheimer's disease, unspecified: Secondary | ICD-10-CM | POA: Diagnosis not present

## 2018-12-05 DIAGNOSIS — Z87440 Personal history of urinary (tract) infections: Secondary | ICD-10-CM | POA: Diagnosis not present

## 2018-12-05 DIAGNOSIS — R1310 Dysphagia, unspecified: Secondary | ICD-10-CM | POA: Diagnosis not present

## 2018-12-05 DIAGNOSIS — G309 Alzheimer's disease, unspecified: Secondary | ICD-10-CM | POA: Diagnosis not present

## 2018-12-05 DIAGNOSIS — I10 Essential (primary) hypertension: Secondary | ICD-10-CM | POA: Diagnosis not present

## 2018-12-05 DIAGNOSIS — R627 Adult failure to thrive: Secondary | ICD-10-CM | POA: Diagnosis not present

## 2018-12-05 DIAGNOSIS — F0280 Dementia in other diseases classified elsewhere without behavioral disturbance: Secondary | ICD-10-CM | POA: Diagnosis not present

## 2018-12-12 DIAGNOSIS — G309 Alzheimer's disease, unspecified: Secondary | ICD-10-CM | POA: Diagnosis not present

## 2018-12-12 DIAGNOSIS — F0280 Dementia in other diseases classified elsewhere without behavioral disturbance: Secondary | ICD-10-CM | POA: Diagnosis not present

## 2018-12-12 DIAGNOSIS — R627 Adult failure to thrive: Secondary | ICD-10-CM | POA: Diagnosis not present

## 2018-12-12 DIAGNOSIS — I10 Essential (primary) hypertension: Secondary | ICD-10-CM | POA: Diagnosis not present

## 2018-12-12 DIAGNOSIS — R1310 Dysphagia, unspecified: Secondary | ICD-10-CM | POA: Diagnosis not present

## 2018-12-12 DIAGNOSIS — Z87440 Personal history of urinary (tract) infections: Secondary | ICD-10-CM | POA: Diagnosis not present

## 2018-12-20 DIAGNOSIS — Z87440 Personal history of urinary (tract) infections: Secondary | ICD-10-CM | POA: Diagnosis not present

## 2018-12-20 DIAGNOSIS — I10 Essential (primary) hypertension: Secondary | ICD-10-CM | POA: Diagnosis not present

## 2018-12-20 DIAGNOSIS — R627 Adult failure to thrive: Secondary | ICD-10-CM | POA: Diagnosis not present

## 2018-12-20 DIAGNOSIS — F0280 Dementia in other diseases classified elsewhere without behavioral disturbance: Secondary | ICD-10-CM | POA: Diagnosis not present

## 2018-12-20 DIAGNOSIS — G309 Alzheimer's disease, unspecified: Secondary | ICD-10-CM | POA: Diagnosis not present

## 2018-12-20 DIAGNOSIS — R1310 Dysphagia, unspecified: Secondary | ICD-10-CM | POA: Diagnosis not present

## 2018-12-26 DIAGNOSIS — Z741 Need for assistance with personal care: Secondary | ICD-10-CM | POA: Diagnosis not present

## 2018-12-26 DIAGNOSIS — Z6820 Body mass index (BMI) 20.0-20.9, adult: Secondary | ICD-10-CM | POA: Diagnosis not present

## 2018-12-26 DIAGNOSIS — G309 Alzheimer's disease, unspecified: Secondary | ICD-10-CM | POA: Diagnosis not present

## 2018-12-26 DIAGNOSIS — F0280 Dementia in other diseases classified elsewhere without behavioral disturbance: Secondary | ICD-10-CM | POA: Diagnosis not present

## 2018-12-26 DIAGNOSIS — I10 Essential (primary) hypertension: Secondary | ICD-10-CM | POA: Diagnosis not present

## 2018-12-26 DIAGNOSIS — R627 Adult failure to thrive: Secondary | ICD-10-CM | POA: Diagnosis not present

## 2018-12-26 DIAGNOSIS — Z87440 Personal history of urinary (tract) infections: Secondary | ICD-10-CM | POA: Diagnosis not present

## 2018-12-26 DIAGNOSIS — R1310 Dysphagia, unspecified: Secondary | ICD-10-CM | POA: Diagnosis not present

## 2018-12-26 DIAGNOSIS — S72002D Fracture of unspecified part of neck of left femur, subsequent encounter for closed fracture with routine healing: Secondary | ICD-10-CM | POA: Diagnosis not present

## 2019-01-02 DIAGNOSIS — Z87440 Personal history of urinary (tract) infections: Secondary | ICD-10-CM | POA: Diagnosis not present

## 2019-01-02 DIAGNOSIS — R1310 Dysphagia, unspecified: Secondary | ICD-10-CM | POA: Diagnosis not present

## 2019-01-02 DIAGNOSIS — R627 Adult failure to thrive: Secondary | ICD-10-CM | POA: Diagnosis not present

## 2019-01-02 DIAGNOSIS — I10 Essential (primary) hypertension: Secondary | ICD-10-CM | POA: Diagnosis not present

## 2019-01-02 DIAGNOSIS — F0280 Dementia in other diseases classified elsewhere without behavioral disturbance: Secondary | ICD-10-CM | POA: Diagnosis not present

## 2019-01-02 DIAGNOSIS — G309 Alzheimer's disease, unspecified: Secondary | ICD-10-CM | POA: Diagnosis not present

## 2019-01-09 DIAGNOSIS — R1310 Dysphagia, unspecified: Secondary | ICD-10-CM | POA: Diagnosis not present

## 2019-01-09 DIAGNOSIS — F0280 Dementia in other diseases classified elsewhere without behavioral disturbance: Secondary | ICD-10-CM | POA: Diagnosis not present

## 2019-01-09 DIAGNOSIS — R627 Adult failure to thrive: Secondary | ICD-10-CM | POA: Diagnosis not present

## 2019-01-09 DIAGNOSIS — I10 Essential (primary) hypertension: Secondary | ICD-10-CM | POA: Diagnosis not present

## 2019-01-09 DIAGNOSIS — G309 Alzheimer's disease, unspecified: Secondary | ICD-10-CM | POA: Diagnosis not present

## 2019-01-09 DIAGNOSIS — Z87440 Personal history of urinary (tract) infections: Secondary | ICD-10-CM | POA: Diagnosis not present

## 2019-01-13 DIAGNOSIS — R627 Adult failure to thrive: Secondary | ICD-10-CM | POA: Diagnosis not present

## 2019-01-13 DIAGNOSIS — Z87440 Personal history of urinary (tract) infections: Secondary | ICD-10-CM | POA: Diagnosis not present

## 2019-01-13 DIAGNOSIS — R1310 Dysphagia, unspecified: Secondary | ICD-10-CM | POA: Diagnosis not present

## 2019-01-13 DIAGNOSIS — F0280 Dementia in other diseases classified elsewhere without behavioral disturbance: Secondary | ICD-10-CM | POA: Diagnosis not present

## 2019-01-13 DIAGNOSIS — G309 Alzheimer's disease, unspecified: Secondary | ICD-10-CM | POA: Diagnosis not present

## 2019-01-13 DIAGNOSIS — I10 Essential (primary) hypertension: Secondary | ICD-10-CM | POA: Diagnosis not present

## 2019-01-23 DIAGNOSIS — G309 Alzheimer's disease, unspecified: Secondary | ICD-10-CM | POA: Diagnosis not present

## 2019-01-23 DIAGNOSIS — R627 Adult failure to thrive: Secondary | ICD-10-CM | POA: Diagnosis not present

## 2019-01-23 DIAGNOSIS — Z87440 Personal history of urinary (tract) infections: Secondary | ICD-10-CM | POA: Diagnosis not present

## 2019-01-23 DIAGNOSIS — R1310 Dysphagia, unspecified: Secondary | ICD-10-CM | POA: Diagnosis not present

## 2019-01-23 DIAGNOSIS — F0280 Dementia in other diseases classified elsewhere without behavioral disturbance: Secondary | ICD-10-CM | POA: Diagnosis not present

## 2019-01-23 DIAGNOSIS — I10 Essential (primary) hypertension: Secondary | ICD-10-CM | POA: Diagnosis not present

## 2019-01-26 DIAGNOSIS — Z87440 Personal history of urinary (tract) infections: Secondary | ICD-10-CM | POA: Diagnosis not present

## 2019-01-26 DIAGNOSIS — Z741 Need for assistance with personal care: Secondary | ICD-10-CM | POA: Diagnosis not present

## 2019-01-26 DIAGNOSIS — I10 Essential (primary) hypertension: Secondary | ICD-10-CM | POA: Diagnosis not present

## 2019-01-26 DIAGNOSIS — S72002D Fracture of unspecified part of neck of left femur, subsequent encounter for closed fracture with routine healing: Secondary | ICD-10-CM | POA: Diagnosis not present

## 2019-01-26 DIAGNOSIS — F0280 Dementia in other diseases classified elsewhere without behavioral disturbance: Secondary | ICD-10-CM | POA: Diagnosis not present

## 2019-01-26 DIAGNOSIS — G309 Alzheimer's disease, unspecified: Secondary | ICD-10-CM | POA: Diagnosis not present

## 2019-01-26 DIAGNOSIS — R627 Adult failure to thrive: Secondary | ICD-10-CM | POA: Diagnosis not present

## 2019-01-26 DIAGNOSIS — R1310 Dysphagia, unspecified: Secondary | ICD-10-CM | POA: Diagnosis not present

## 2019-01-26 DIAGNOSIS — Z6820 Body mass index (BMI) 20.0-20.9, adult: Secondary | ICD-10-CM | POA: Diagnosis not present

## 2019-01-29 DIAGNOSIS — Z87440 Personal history of urinary (tract) infections: Secondary | ICD-10-CM | POA: Diagnosis not present

## 2019-01-29 DIAGNOSIS — R627 Adult failure to thrive: Secondary | ICD-10-CM | POA: Diagnosis not present

## 2019-01-29 DIAGNOSIS — R1310 Dysphagia, unspecified: Secondary | ICD-10-CM | POA: Diagnosis not present

## 2019-01-29 DIAGNOSIS — I10 Essential (primary) hypertension: Secondary | ICD-10-CM | POA: Diagnosis not present

## 2019-01-29 DIAGNOSIS — G309 Alzheimer's disease, unspecified: Secondary | ICD-10-CM | POA: Diagnosis not present

## 2019-01-29 DIAGNOSIS — F0280 Dementia in other diseases classified elsewhere without behavioral disturbance: Secondary | ICD-10-CM | POA: Diagnosis not present

## 2019-01-30 DIAGNOSIS — Z87440 Personal history of urinary (tract) infections: Secondary | ICD-10-CM | POA: Diagnosis not present

## 2019-01-30 DIAGNOSIS — G309 Alzheimer's disease, unspecified: Secondary | ICD-10-CM | POA: Diagnosis not present

## 2019-01-30 DIAGNOSIS — I10 Essential (primary) hypertension: Secondary | ICD-10-CM | POA: Diagnosis not present

## 2019-01-30 DIAGNOSIS — F0280 Dementia in other diseases classified elsewhere without behavioral disturbance: Secondary | ICD-10-CM | POA: Diagnosis not present

## 2019-01-30 DIAGNOSIS — R627 Adult failure to thrive: Secondary | ICD-10-CM | POA: Diagnosis not present

## 2019-01-30 DIAGNOSIS — R1310 Dysphagia, unspecified: Secondary | ICD-10-CM | POA: Diagnosis not present

## 2019-02-06 DIAGNOSIS — R1310 Dysphagia, unspecified: Secondary | ICD-10-CM | POA: Diagnosis not present

## 2019-02-06 DIAGNOSIS — R627 Adult failure to thrive: Secondary | ICD-10-CM | POA: Diagnosis not present

## 2019-02-06 DIAGNOSIS — Z87440 Personal history of urinary (tract) infections: Secondary | ICD-10-CM | POA: Diagnosis not present

## 2019-02-06 DIAGNOSIS — F0280 Dementia in other diseases classified elsewhere without behavioral disturbance: Secondary | ICD-10-CM | POA: Diagnosis not present

## 2019-02-06 DIAGNOSIS — I10 Essential (primary) hypertension: Secondary | ICD-10-CM | POA: Diagnosis not present

## 2019-02-06 DIAGNOSIS — G309 Alzheimer's disease, unspecified: Secondary | ICD-10-CM | POA: Diagnosis not present

## 2019-02-13 DIAGNOSIS — R1310 Dysphagia, unspecified: Secondary | ICD-10-CM | POA: Diagnosis not present

## 2019-02-13 DIAGNOSIS — F0280 Dementia in other diseases classified elsewhere without behavioral disturbance: Secondary | ICD-10-CM | POA: Diagnosis not present

## 2019-02-13 DIAGNOSIS — G309 Alzheimer's disease, unspecified: Secondary | ICD-10-CM | POA: Diagnosis not present

## 2019-02-13 DIAGNOSIS — Z87440 Personal history of urinary (tract) infections: Secondary | ICD-10-CM | POA: Diagnosis not present

## 2019-02-13 DIAGNOSIS — I10 Essential (primary) hypertension: Secondary | ICD-10-CM | POA: Diagnosis not present

## 2019-02-13 DIAGNOSIS — R627 Adult failure to thrive: Secondary | ICD-10-CM | POA: Diagnosis not present

## 2019-02-21 DIAGNOSIS — I10 Essential (primary) hypertension: Secondary | ICD-10-CM | POA: Diagnosis not present

## 2019-02-21 DIAGNOSIS — R627 Adult failure to thrive: Secondary | ICD-10-CM | POA: Diagnosis not present

## 2019-02-21 DIAGNOSIS — Z87440 Personal history of urinary (tract) infections: Secondary | ICD-10-CM | POA: Diagnosis not present

## 2019-02-21 DIAGNOSIS — F0280 Dementia in other diseases classified elsewhere without behavioral disturbance: Secondary | ICD-10-CM | POA: Diagnosis not present

## 2019-02-21 DIAGNOSIS — R1310 Dysphagia, unspecified: Secondary | ICD-10-CM | POA: Diagnosis not present

## 2019-02-21 DIAGNOSIS — G309 Alzheimer's disease, unspecified: Secondary | ICD-10-CM | POA: Diagnosis not present

## 2019-03-25 IMAGING — DX DG CHEST 1V PORT
1 series · 1 of 1 positions shown · non-contrast
Comparison: None in PACs

CLINICAL DATA: Altered mental status. Underlying Alzheimer's
disease. Nonsmoker.

EXAM:
PORTABLE CHEST 1 VIEW

[chest ap]
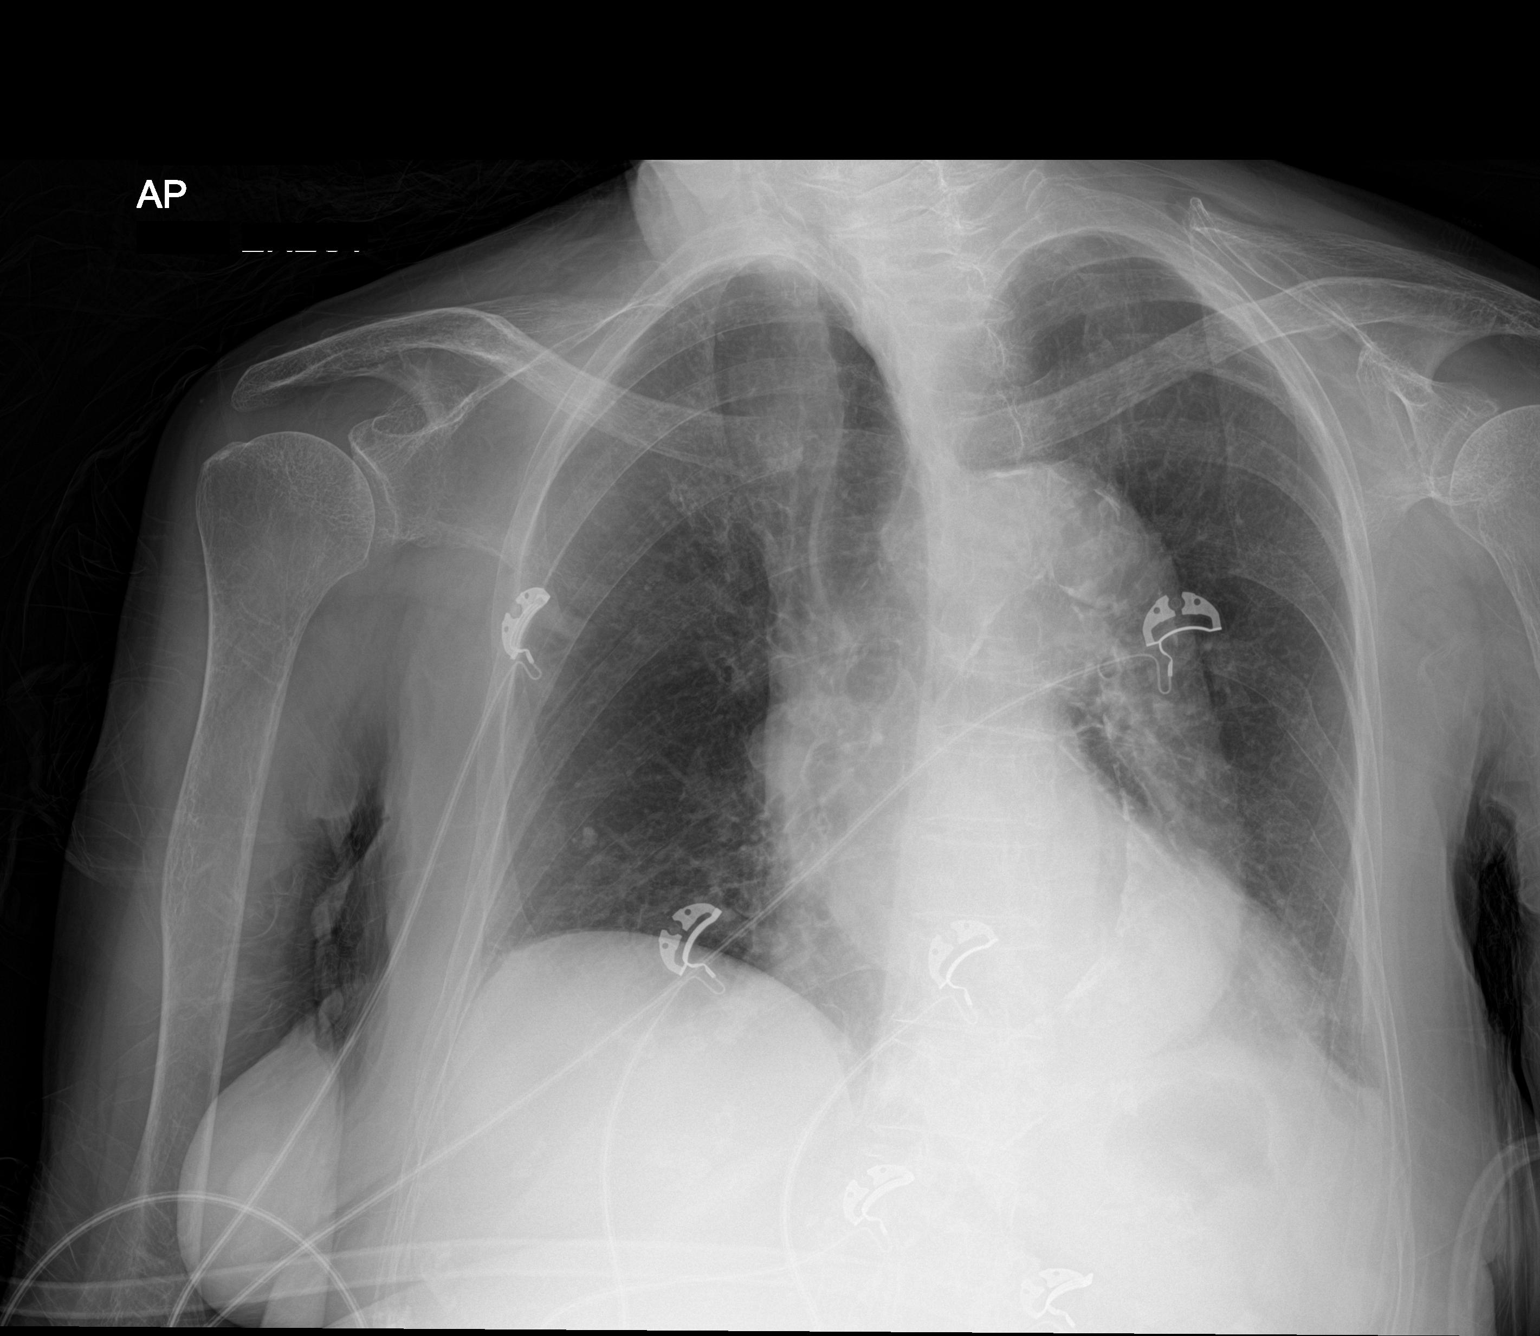

[1 of 1 positions shown; findings below may reference images not displayed]

FINDINGS: The lungs are adequately inflated and clear. The heart is top-normal
in size. The pulmonary vascularity is normal. There is mural
calcification in the thoracic aorta with tortuosity of its ascending
and descending components. The mediastinum is grossly normal in
width. The bony thorax is unremarkable.
IMPRESSION: There is no active cardiopulmonary disease.

Thoracic aortic atherosclerosis.

## 2019-03-25 IMAGING — CT CT HEAD W/O CM
3 series · 15 of 43 positions shown, 18 images · non-contrast
Comparison: None.

CLINICAL DATA: Altered level of consciousness.

EXAM:
CT HEAD WITHOUT CONTRAST
TECHNIQUE: Contiguous axial images were obtained from the base of the skull
through the vertex without intravenous contrast.

[Series 2: head wo · axial · 0.39mm/px · z∈[-123,-18]mm · 9 of 26 slices shown, 12 images]
[im 3/26  brain]
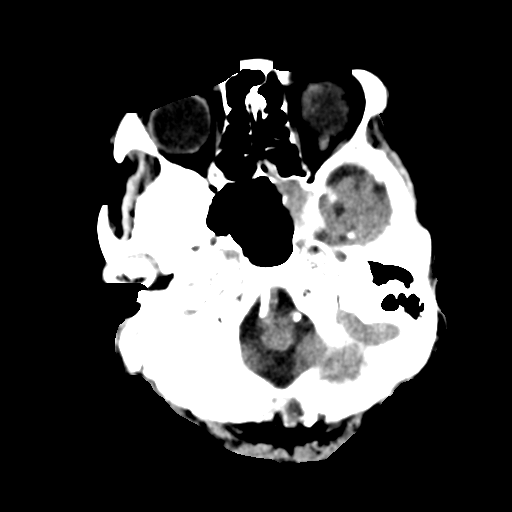
[im 3/26  bone]
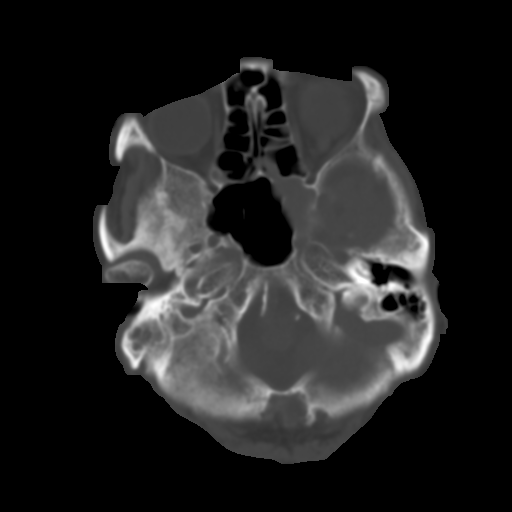
[im 6/26  brain]
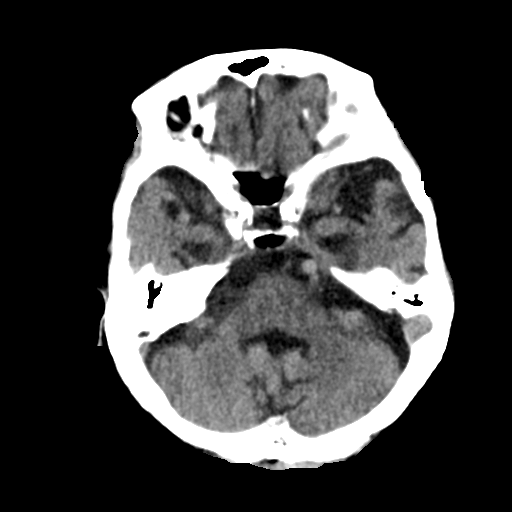
[im 8/26  brain]
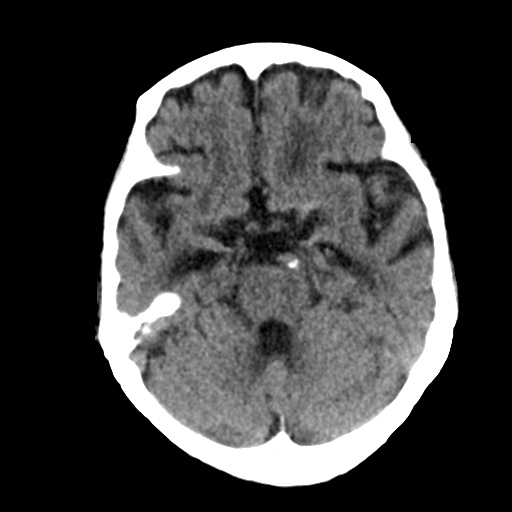
[im 11/26  brain]
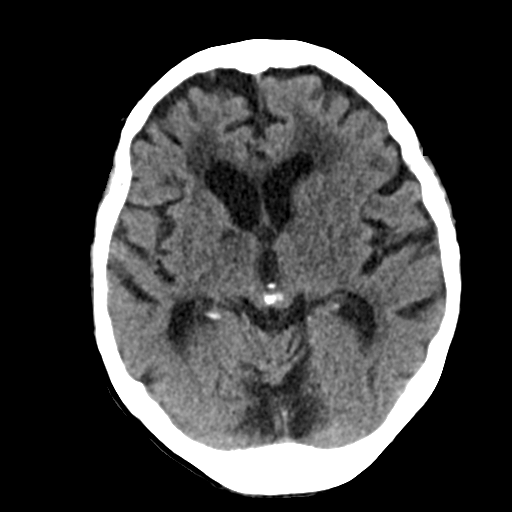
[im 14/26  brain]
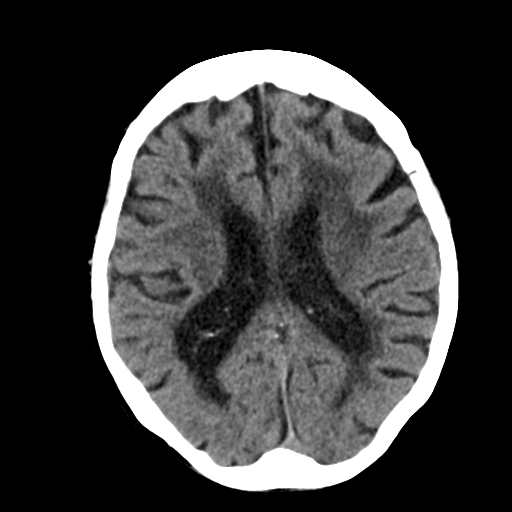
[im 14/26  bone]
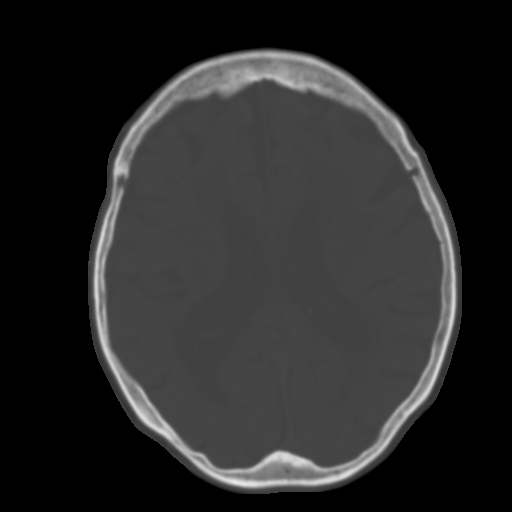
[im 16/26  brain]
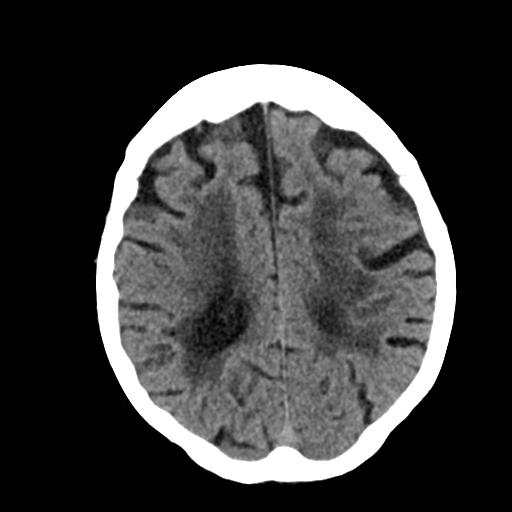
[im 19/26  brain]
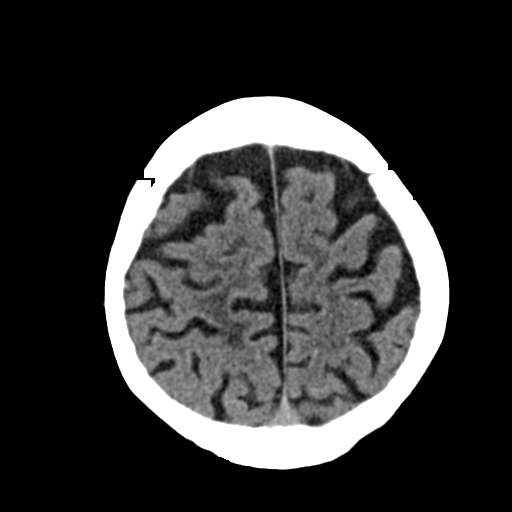
[im 22/26  brain]
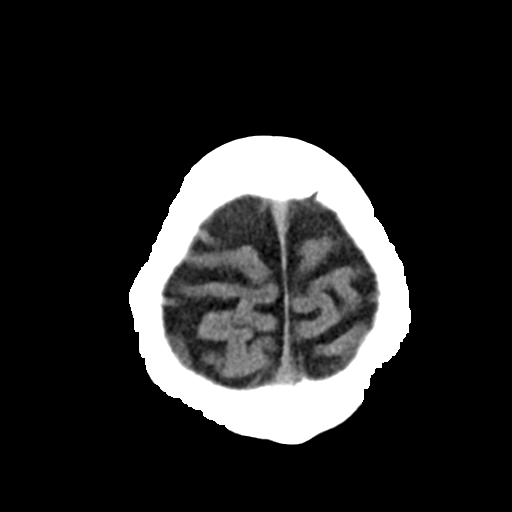
[im 24/26  brain]
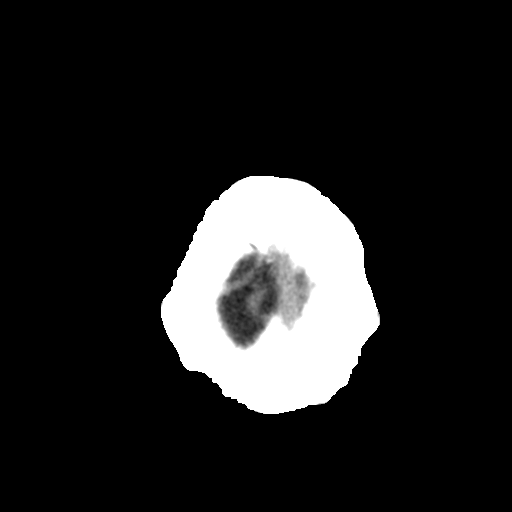
[im 24/26  bone]
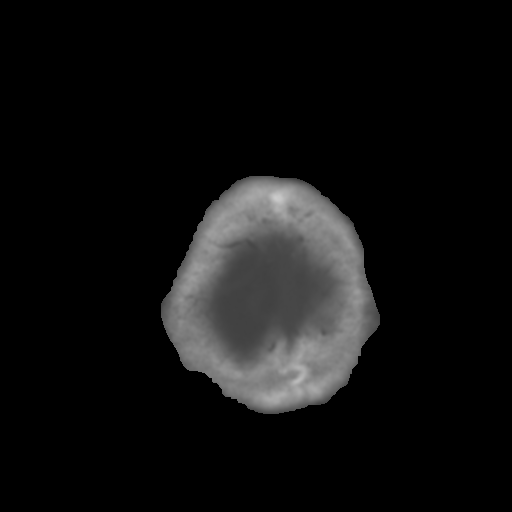

[Series 4: coronal soft tissue · coronal · 0.28mm/px · 3 of 60 slices shown]
[im 20/60  brain]
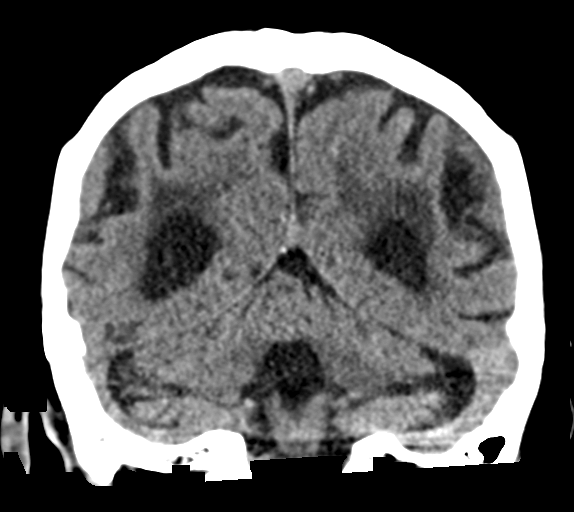
[im 27/60  brain]
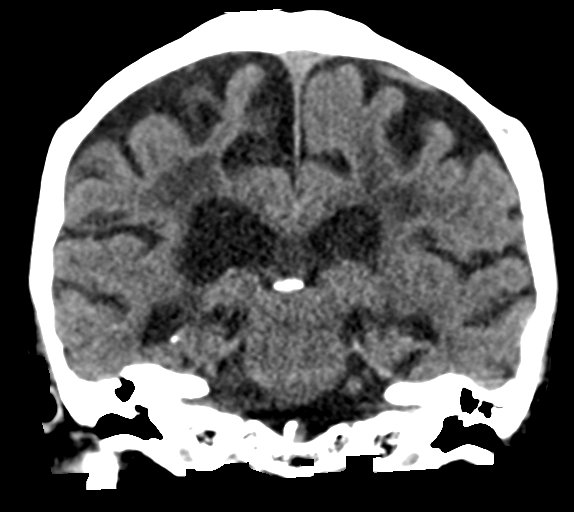
[im 33/60  brain]
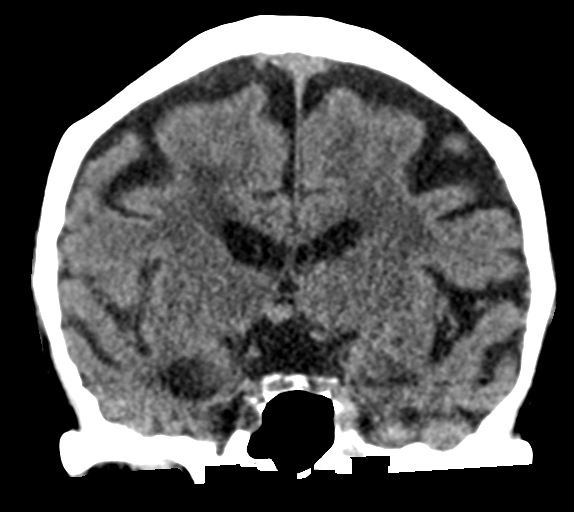

[Series 5: sagittal soft tissue · sagittal · 0.28mm/px · 3 of 51 slices shown]
[im 17/51  brain]
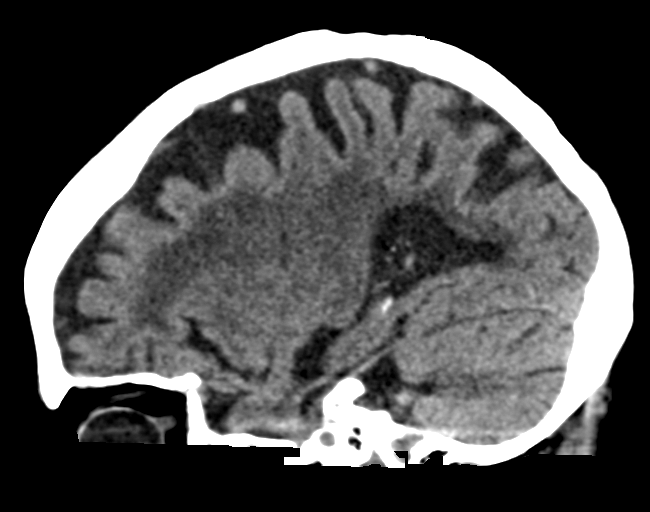
[im 26/51  brain]
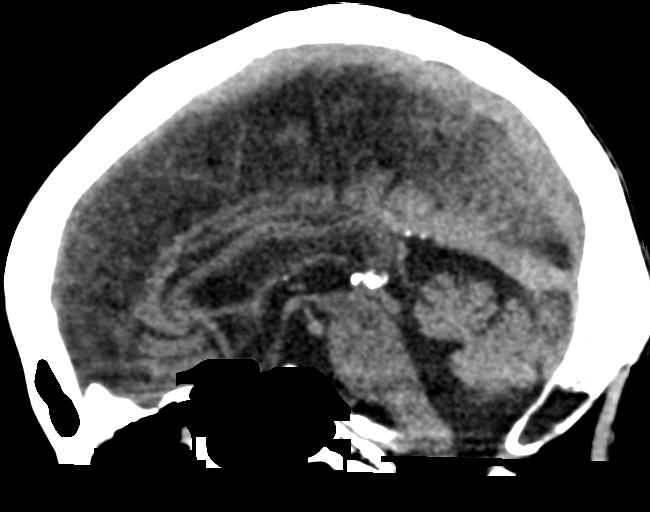
[im 34/51  brain]
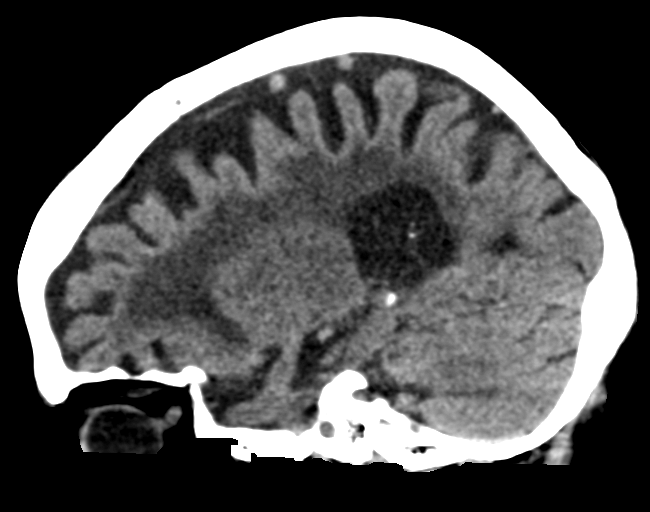

[15 of 43 positions shown; findings below may reference images not displayed]

FINDINGS: Brain: Moderate to advanced atrophy. Negative for hydrocephalus.
Extensive chronic white matter changes.

Negative for acute infarct. Negative for hemorrhage mass or fluid
collection.

Vascular: Negative for hyperdense vessel. Heavily calcified
arteries.

Skull: Large lytic lesion in the occipital bone in the midline most
compatible with a benign lesion such as hemangioma. This has fatty
density.

Sinuses/Orbits: Mucosal edema in the left sphenoid maxillary and
ethmoid sinuses. Negative orbit

Other: None
IMPRESSION: Moderate to advanced atrophy and chronic microvascular ischemia. No
acute intracranial abnormality.

## 2019-03-28 DIAGNOSIS — R627 Adult failure to thrive: Secondary | ICD-10-CM | POA: Diagnosis not present

## 2019-03-28 DIAGNOSIS — R1310 Dysphagia, unspecified: Secondary | ICD-10-CM | POA: Diagnosis not present

## 2019-03-28 DIAGNOSIS — Z741 Need for assistance with personal care: Secondary | ICD-10-CM | POA: Diagnosis not present

## 2019-03-28 DIAGNOSIS — Z87440 Personal history of urinary (tract) infections: Secondary | ICD-10-CM | POA: Diagnosis not present

## 2019-03-28 DIAGNOSIS — Z6820 Body mass index (BMI) 20.0-20.9, adult: Secondary | ICD-10-CM | POA: Diagnosis not present

## 2019-03-28 DIAGNOSIS — F0280 Dementia in other diseases classified elsewhere without behavioral disturbance: Secondary | ICD-10-CM | POA: Diagnosis not present

## 2019-03-28 DIAGNOSIS — I10 Essential (primary) hypertension: Secondary | ICD-10-CM | POA: Diagnosis not present

## 2019-03-28 DIAGNOSIS — G309 Alzheimer's disease, unspecified: Secondary | ICD-10-CM | POA: Diagnosis not present

## 2019-03-28 DIAGNOSIS — S72002D Fracture of unspecified part of neck of left femur, subsequent encounter for closed fracture with routine healing: Secondary | ICD-10-CM | POA: Diagnosis not present

## 2019-04-03 DIAGNOSIS — Z87440 Personal history of urinary (tract) infections: Secondary | ICD-10-CM | POA: Diagnosis not present

## 2019-04-03 DIAGNOSIS — G309 Alzheimer's disease, unspecified: Secondary | ICD-10-CM | POA: Diagnosis not present

## 2019-04-03 DIAGNOSIS — F0280 Dementia in other diseases classified elsewhere without behavioral disturbance: Secondary | ICD-10-CM | POA: Diagnosis not present

## 2019-04-03 DIAGNOSIS — R627 Adult failure to thrive: Secondary | ICD-10-CM | POA: Diagnosis not present

## 2019-04-03 DIAGNOSIS — R1310 Dysphagia, unspecified: Secondary | ICD-10-CM | POA: Diagnosis not present

## 2019-04-03 DIAGNOSIS — I10 Essential (primary) hypertension: Secondary | ICD-10-CM | POA: Diagnosis not present

## 2019-04-04 DIAGNOSIS — R627 Adult failure to thrive: Secondary | ICD-10-CM | POA: Diagnosis not present

## 2019-04-04 DIAGNOSIS — Z87440 Personal history of urinary (tract) infections: Secondary | ICD-10-CM | POA: Diagnosis not present

## 2019-04-04 DIAGNOSIS — G309 Alzheimer's disease, unspecified: Secondary | ICD-10-CM | POA: Diagnosis not present

## 2019-04-04 DIAGNOSIS — F0280 Dementia in other diseases classified elsewhere without behavioral disturbance: Secondary | ICD-10-CM | POA: Diagnosis not present

## 2019-04-04 DIAGNOSIS — I10 Essential (primary) hypertension: Secondary | ICD-10-CM | POA: Diagnosis not present

## 2019-04-04 DIAGNOSIS — R1310 Dysphagia, unspecified: Secondary | ICD-10-CM | POA: Diagnosis not present

## 2019-04-28 DEATH — deceased
